# Patient Record
Sex: Male | Born: 1970 | Hispanic: No | Marital: Married | Smoking: Former smoker
Health system: Southern US, Community
[De-identification: ages and names within clinical notes are randomized; demographics above are authoritative.]

## PROBLEM LIST (undated history)

## (undated) DIAGNOSIS — K644 Residual hemorrhoidal skin tags: Secondary | ICD-10-CM

## (undated) DIAGNOSIS — Z9109 Other allergy status, other than to drugs and biological substances: Secondary | ICD-10-CM

## (undated) DIAGNOSIS — K219 Gastro-esophageal reflux disease without esophagitis: Secondary | ICD-10-CM

## (undated) DIAGNOSIS — L309 Dermatitis, unspecified: Secondary | ICD-10-CM

## (undated) DIAGNOSIS — IMO0002 Reserved for concepts with insufficient information to code with codable children: Secondary | ICD-10-CM

## (undated) DIAGNOSIS — M26609 Unspecified temporomandibular joint disorder, unspecified side: Secondary | ICD-10-CM

## (undated) HISTORY — DX: Dermatitis, unspecified: L30.9

## (undated) HISTORY — DX: Unspecified temporomandibular joint disorder, unspecified side: M26.609

## (undated) HISTORY — PX: TONSILLECTOMY: SUR1361

## (undated) HISTORY — DX: Residual hemorrhoidal skin tags: K64.4

## (undated) HISTORY — DX: Gastro-esophageal reflux disease without esophagitis: K21.9

## (undated) HISTORY — DX: Other allergy status, other than to drugs and biological substances: Z91.09

## (undated) HISTORY — DX: Reserved for concepts with insufficient information to code with codable children: IMO0002

---

## 2010-07-04 HISTORY — PX: ANTERIOR CRUCIATE LIGAMENT REPAIR: SHX115

## 2011-08-19 ENCOUNTER — Ambulatory Visit (INDEPENDENT_AMBULATORY_CARE_PROVIDER_SITE_OTHER): Admitting: Family Medicine

## 2011-08-19 ENCOUNTER — Encounter: Payer: Self-pay | Admitting: Family Medicine

## 2011-08-19 VITALS — BP 108/64 | HR 68 | Temp 98.3°F | Ht 71.75 in | Wt 186.0 lb

## 2011-08-19 DIAGNOSIS — Z0000 Encounter for general adult medical examination without abnormal findings: Secondary | ICD-10-CM

## 2011-08-19 LAB — CBC WITH DIFFERENTIAL/PLATELET
Basophils Absolute: 0 10*3/uL (ref 0.0–0.1)
Eosinophils Absolute: 0 10*3/uL (ref 0.0–0.7)
HCT: 45.4 % (ref 39.0–52.0)
Lymphs Abs: 1.6 10*3/uL (ref 0.7–4.0)
MCHC: 33.4 g/dL (ref 30.0–36.0)
Monocytes Absolute: 0.4 10*3/uL (ref 0.1–1.0)
Monocytes Relative: 6.2 % (ref 3.0–12.0)
Platelets: 201 10*3/uL (ref 150.0–400.0)
RDW: 13.4 % (ref 11.5–14.6)

## 2011-08-19 LAB — POCT URINALYSIS DIPSTICK
Bilirubin, UA: NEGATIVE
Glucose, UA: NEGATIVE
Ketones, UA: NEGATIVE
Leukocytes, UA: NEGATIVE
Nitrite, UA: NEGATIVE
Protein, UA: NEGATIVE
Spec Grav, UA: 1.02
Urobilinogen, UA: 0.2
pH, UA: 6.5

## 2011-08-19 LAB — LIPID PANEL
Cholesterol: 197 mg/dL (ref 0–200)
HDL: 60.2 mg/dL
LDL Cholesterol: 123 mg/dL — ABNORMAL HIGH (ref 0–99)
Total CHOL/HDL Ratio: 3
Triglycerides: 70 mg/dL (ref 0.0–149.0)
VLDL: 14 mg/dL (ref 0.0–40.0)

## 2011-08-19 LAB — BASIC METABOLIC PANEL
BUN: 18 mg/dL (ref 6–23)
CO2: 28 mEq/L (ref 19–32)
Chloride: 102 mEq/L (ref 96–112)
Creatinine, Ser: 0.9 mg/dL (ref 0.4–1.5)

## 2011-08-19 LAB — HEPATIC FUNCTION PANEL
ALT: 18 U/L (ref 0–53)
Bilirubin, Direct: 0.1 mg/dL (ref 0.0–0.3)
Total Protein: 7.1 g/dL (ref 6.0–8.3)

## 2011-08-19 LAB — TSH: TSH: 1.49 u[IU]/mL (ref 0.35–5.50)

## 2011-08-19 MED ORDER — HYDROCORTISONE ACETATE 25 MG RE SUPP: 25.0000 mg | Freq: Two times a day (BID) | RECTAL | Status: AC | PRN

## 2011-08-22 ENCOUNTER — Encounter: Payer: Self-pay | Admitting: Family Medicine

## 2011-08-22 NOTE — Progress Notes (Signed)
  Subjective:    Patient ID: Peter Macias, male    DOB: 1970/11/07, 41 y.o.   MRN: [id number]  HPI 41 yr old male to establish and for a cpx. He had been getting his medical care at the [place] but has transferred to [place]. He is a Education officer, community in town. He feels fine in general. He has some intermittent lower back pain that he controls with exercise. He runs now and then, he does core strengthening, and he lifts some weights. He does have some hemorrhoids that flare up from time to time. He has used several rx creams but these do not help the burning and pain he feels just inside the anus.    Review of Systems  Constitutional: Negative.   HENT: Negative.   Eyes: Negative.   Respiratory: Negative.   Cardiovascular: Negative.   Gastrointestinal: Negative.   Genitourinary: Negative.   Musculoskeletal: Negative.   Skin: Negative.   Neurological: Negative.   Hematological: Negative.   Psychiatric/Behavioral: Negative.        Objective:   Physical Exam  Constitutional: He is oriented to person, place, and time. He appears well-developed and well-nourished. No distress.  HENT:  Head: Normocephalic and atraumatic.  Right Ear: External ear normal.  Left Ear: External ear normal.  Nose: Nose normal.  Mouth/Throat: Oropharynx is clear and moist. No oropharyngeal exudate.  Eyes: Conjunctivae and EOM are normal. Pupils are equal, round, and reactive to light. Right eye exhibits no discharge. Left eye exhibits no discharge. No scleral icterus.  Neck: Neck supple. No JVD present. No tracheal deviation present. No thyromegaly present.  Cardiovascular: Normal rate, regular rhythm, normal heart sounds and intact distal pulses.  Exam reveals no gallop and no friction rub.   No murmur heard. Pulmonary/Chest: Effort normal and breath sounds normal. No respiratory distress. He has no wheezes. He has no rales. He exhibits no tenderness.  Abdominal: Soft. Bowel sounds are normal. He exhibits no distension and no  mass. There is no tenderness. There is no rebound and no guarding.  Genitourinary: Rectum normal, prostate normal and penis normal. Guaiac negative stool. No penile tenderness.  Musculoskeletal: Normal range of motion. He exhibits no edema and no tenderness.  Lymphadenopathy:    He has no cervical adenopathy.  Neurological: He is alert and oriented to person, place, and time. He has normal reflexes. No cranial nerve deficit. He exhibits normal muscle tone. Coordination normal.  Skin: Skin is warm and dry. No rash noted. He is not diaphoretic. No erythema. No pallor.  Psychiatric: He has a normal mood and affect. His behavior is normal. Judgment and thought content normal.          Assessment & Plan:  Well exam. Get fasting labs today. Refer to Dermatology. Try Anusol HC suppositories for the hemorrhoids.

## 2011-08-23 ENCOUNTER — Encounter: Payer: Self-pay | Admitting: Family Medicine

## 2011-08-23 ENCOUNTER — Telehealth: Payer: Self-pay | Admitting: Family Medicine

## 2011-08-23 NOTE — Telephone Encounter (Signed)
Put a copy of lab results in mail.

## 2011-08-23 NOTE — Progress Notes (Signed)
Quick Note:  Left voice message and put a copy of results in mail. ______ 

## 2012-09-14 ENCOUNTER — Ambulatory Visit (INDEPENDENT_AMBULATORY_CARE_PROVIDER_SITE_OTHER): Payer: BC Managed Care – PPO | Admitting: Family Medicine

## 2012-09-14 ENCOUNTER — Encounter: Payer: Self-pay | Admitting: Family Medicine

## 2012-09-14 VITALS — BP 103/72 | Ht 71.0 in | Wt 175.0 lb

## 2012-09-14 DIAGNOSIS — M79661 Pain in right lower leg: Secondary | ICD-10-CM

## 2012-09-14 NOTE — Progress Notes (Signed)
  Subjective:    Patient ID: Peter Macias, male    DOB: Jun 11, 1971, 42 y.o.   MRN: [id number]  HPI chief complaint: Right calf pain  Patient comes in today complaining of right calf pain. He initially injured the calf while skiing. While falling forward on his skis with a fully extended knee he felt a pull along the medial aspect of his right calf. He had some discoloration and swelling as well as a palpable abnormality. He had bruising which extended distally to the medial ankle. He utilize an Ace wrap for compression and his symptoms resolved until he reinjured it earlier this week while working out. Specifically, he was doing burpees when he felt an acute return of pain. He has developed some mild swelling and has returned to using his Ace wrap. He denies pain distally at the Achilles tendon. No significant problems with his cath in the past. He has no problems with walking.  Patient's past medical history is positive for lumbar degenerative disc disease. Surgical history is significant for anterior cruciate ligament reconstruction in the left knee in 2012 done by Dr. Richardson Landry He takes no chronic medications No known drug allergies He is a former smoker, drinks alcohol one to 2 times a week, and is self-employed as a Education officer, community    Review of Systems     Objective:   Physical Exam Well-developed, fit appearing 42 year old male. No acute distress.  Right lower leg: Mild tenderness to palpation along the medial gastroc but not markedly. There is no palpable defect. No spasm. Minimal swelling of the lower leg diffusely. No ecchymosis. Patient has mild pain with calf stretching. Achilles is intact with positive plantar flexion with Thompson's testing. Neurovascularly intact distally. Walking without a limp.      Assessment & Plan:  1. Right lower leg pain secondary to calf strain  I did a brief MSK ultrasound of the right lower leg and did not see any obvious abnormalities of either the  gastrocnemius or soleus. It is possible that the patient has a small injury to the plantaris tendon. Nonetheless, our treatment will consist of compression with a body helix sleeve and a 5/16 inch heel lift. He is okay to bike and start with some light weight lifting. He is to avoid anything explosive such as sprinting for the next 6 weeks. He will wear the body helix compression sleeve with activity. He can discontinue the heel lifts once he is pain-free and back to full activity. He will avoid stretching this acutely injured structure. He is reassured that his Achilles tendon is intact. He will followup for ongoing or recalcitrant issues.

## 2013-02-15 ENCOUNTER — Other Ambulatory Visit: Payer: BC Managed Care – PPO

## 2013-02-22 ENCOUNTER — Other Ambulatory Visit (INDEPENDENT_AMBULATORY_CARE_PROVIDER_SITE_OTHER): Payer: No Typology Code available for payment source

## 2013-02-22 DIAGNOSIS — Z0000 Encounter for general adult medical examination without abnormal findings: Secondary | ICD-10-CM

## 2013-02-22 LAB — CBC WITH DIFFERENTIAL/PLATELET
Basophils Absolute: 0 10*3/uL (ref 0.0–0.1)
Basophils Relative: 0.4 % (ref 0.0–3.0)
Eosinophils Relative: 2.1 % (ref 0.0–5.0)
HCT: 44.3 % (ref 39.0–52.0)
Hemoglobin: 15.3 g/dL (ref 13.0–17.0)
Lymphocytes Relative: 36.5 % (ref 12.0–46.0)
Lymphs Abs: 1.7 10*3/uL (ref 0.7–4.0)
Monocytes Relative: 8 % (ref 3.0–12.0)
Neutro Abs: 2.5 10*3/uL (ref 1.4–7.7)
RBC: 5.05 Mil/uL (ref 4.22–5.81)
RDW: 13.8 % (ref 11.5–14.6)

## 2013-02-22 LAB — POCT URINALYSIS DIPSTICK
Blood, UA: NEGATIVE
Glucose, UA: NEGATIVE
Nitrite, UA: NEGATIVE
Protein, UA: NEGATIVE
Spec Grav, UA: 1.02
Urobilinogen, UA: 0.2
pH, UA: 6.5

## 2013-02-22 LAB — BASIC METABOLIC PANEL
Calcium: 9.3 mg/dL (ref 8.4–10.5)
GFR: 96.44 mL/min (ref >60.00)
Glucose, Bld: 99 mg/dL (ref 70–99)
Potassium: 4.3 mEq/L (ref 3.5–5.1)
Sodium: 138 mEq/L (ref 135–145)

## 2013-02-22 LAB — TSH: TSH: 1.7 u[IU]/mL (ref 0.35–5.50)

## 2013-02-22 LAB — HEPATIC FUNCTION PANEL
ALT: 18 U/L (ref 0–53)
AST: 17 U/L (ref 0–37)
Albumin: 4.2 g/dL (ref 3.5–5.2)
Alkaline Phosphatase: 60 U/L (ref 39–117)
Total Protein: 6.9 g/dL (ref 6.0–8.3)

## 2013-02-22 LAB — LIPID PANEL
LDL Cholesterol: 99 mg/dL (ref 0–99)
Total CHOL/HDL Ratio: 3
Triglycerides: 63 mg/dL (ref 0.0–149.0)

## 2013-03-15 ENCOUNTER — Encounter: Payer: BC Managed Care – PPO | Admitting: Family Medicine

## 2013-03-22 ENCOUNTER — Encounter: Payer: Self-pay | Admitting: Family Medicine

## 2013-03-22 ENCOUNTER — Ambulatory Visit (INDEPENDENT_AMBULATORY_CARE_PROVIDER_SITE_OTHER): Payer: No Typology Code available for payment source | Admitting: Family Medicine

## 2013-03-22 VITALS — BP 108/70 | HR 79 | Temp 98.1°F | Ht 71.25 in | Wt 174.0 lb

## 2013-03-22 DIAGNOSIS — Z0000 Encounter for general adult medical examination without abnormal findings: Secondary | ICD-10-CM

## 2013-03-22 NOTE — Progress Notes (Signed)
  Subjective:    Patient ID: Peter Macias, male    DOB: 1971/04/18, 42 y.o.   MRN: [id number]  HPI 42 yr old male for a cpx. He feels well.    Review of Systems  Constitutional: Negative.   HENT: Negative.   Eyes: Negative.   Respiratory: Negative.   Cardiovascular: Negative.   Gastrointestinal: Negative.   Genitourinary: Negative.   Musculoskeletal: Negative.   Skin: Negative.   Neurological: Negative.   Psychiatric/Behavioral: Negative.        Objective:   Physical Exam  Constitutional: He is oriented to person, place, and time. He appears well-developed and well-nourished. No distress.  HENT:  Head: Normocephalic and atraumatic.  Right Ear: External ear normal.  Left Ear: External ear normal.  Nose: Nose normal.  Mouth/Throat: Oropharynx is clear and moist. No oropharyngeal exudate.  Eyes: Conjunctivae and EOM are normal. Pupils are equal, round, and reactive to light. Right eye exhibits no discharge. Left eye exhibits no discharge. No scleral icterus.  Neck: Neck supple. No JVD present. No tracheal deviation present. No thyromegaly present.  Cardiovascular: Normal rate, regular rhythm, normal heart sounds and intact distal pulses.  Exam reveals no gallop and no friction rub.   No murmur heard. Pulmonary/Chest: Effort normal and breath sounds normal. No respiratory distress. He has no wheezes. He has no rales. He exhibits no tenderness.  Abdominal: Soft. Bowel sounds are normal. He exhibits no distension and no mass. There is no tenderness. There is no rebound and no guarding.  Genitourinary: Rectum normal, prostate normal and penis normal. Guaiac negative stool. No penile tenderness.  Musculoskeletal: Normal range of motion. He exhibits no edema and no tenderness.  Lymphadenopathy:    He has no cervical adenopathy.  Neurological: He is alert and oriented to person, place, and time. He has normal reflexes. No cranial nerve deficit. He exhibits normal muscle tone.  Coordination normal.  Skin: Skin is warm and dry. No rash noted. He is not diaphoretic. No erythema. No pallor.  Psychiatric: He has a normal mood and affect. His behavior is normal. Judgment and thought content normal.          Assessment & Plan:  Well exam.

## 2013-03-24 ENCOUNTER — Encounter: Payer: Self-pay | Admitting: Family Medicine

## 2013-03-25 ENCOUNTER — Other Ambulatory Visit: Payer: BC Managed Care – PPO

## 2014-08-12 ENCOUNTER — Telehealth: Payer: Self-pay | Admitting: Family Medicine

## 2014-08-12 NOTE — Telephone Encounter (Signed)
Pt would like to have a cpe on feb 19.  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  Pt only has this day off.  pls advise.

## 2014-08-13 NOTE — Telephone Encounter (Signed)
Please set up physicals for both of them that day

## 2014-08-13 NOTE — Telephone Encounter (Signed)
Lm on vm to cb and sched appts.

## 2014-08-13 NOTE — Telephone Encounter (Signed)
done

## 2014-08-22 ENCOUNTER — Encounter: Payer: Self-pay | Admitting: Family Medicine

## 2014-08-22 ENCOUNTER — Ambulatory Visit (INDEPENDENT_AMBULATORY_CARE_PROVIDER_SITE_OTHER): Payer: No Typology Code available for payment source | Admitting: Family Medicine

## 2014-08-22 VITALS — BP 98/63 | HR 56 | Temp 97.7°F | Ht 71.25 in | Wt 186.0 lb

## 2014-08-22 DIAGNOSIS — Z0000 Encounter for general adult medical examination without abnormal findings: Secondary | ICD-10-CM

## 2014-08-22 DIAGNOSIS — Z23 Encounter for immunization: Secondary | ICD-10-CM

## 2014-08-22 LAB — HEPATIC FUNCTION PANEL
ALK PHOS: 60 U/L (ref 39–117)
ALT: 17 U/L (ref 0–53)
AST: 23 U/L (ref 0–37)
Albumin: 4.7 g/dL (ref 3.5–5.2)
BILIRUBIN DIRECT: 0.3 mg/dL (ref 0.0–0.3)
Total Bilirubin: 2 mg/dL — ABNORMAL HIGH (ref 0.2–1.2)
Total Protein: 7.3 g/dL (ref 6.0–8.3)

## 2014-08-22 LAB — CBC WITH DIFFERENTIAL/PLATELET
BASOS ABS: 0 10*3/uL (ref 0.0–0.1)
BASOS PCT: 0.4 % (ref 0.0–3.0)
EOS PCT: 1.8 % (ref 0.0–5.0)
Eosinophils Absolute: 0.1 10*3/uL (ref 0.0–0.7)
HCT: 43.8 % (ref 39.0–52.0)
Hemoglobin: 15.1 g/dL (ref 13.0–17.0)
LYMPHS ABS: 2 10*3/uL (ref 0.7–4.0)
Lymphocytes Relative: 39 % (ref 12.0–46.0)
MCHC: 34.4 g/dL (ref 30.0–36.0)
MCV: 87.2 fl (ref 78.0–100.0)
MONOS PCT: 7.1 % (ref 3.0–12.0)
Monocytes Absolute: 0.4 10*3/uL (ref 0.1–1.0)
NEUTROS ABS: 2.7 10*3/uL (ref 1.4–7.7)
Neutrophils Relative %: 51.7 % (ref 43.0–77.0)
Platelets: 227 10*3/uL (ref 150.0–400.0)
RBC: 5.03 Mil/uL (ref 4.22–5.81)
RDW: 13.2 % (ref 11.5–15.5)
WBC: 5.1 10*3/uL (ref 4.0–10.5)

## 2014-08-22 LAB — BASIC METABOLIC PANEL
BUN: 17 mg/dL (ref 6–23)
CO2: 30 mEq/L (ref 19–32)
CREATININE: 0.95 mg/dL (ref 0.40–1.50)
Calcium: 9.8 mg/dL (ref 8.4–10.5)
Chloride: 102 mEq/L (ref 96–112)
GFR: 92.25 mL/min (ref >60.00)
Glucose, Bld: 80 mg/dL (ref 70–99)
Potassium: 3.7 mEq/L (ref 3.5–5.1)
SODIUM: 138 meq/L (ref 135–145)

## 2014-08-22 LAB — POCT URINALYSIS DIPSTICK
Bilirubin, UA: NEGATIVE
Blood, UA: NEGATIVE
Glucose, UA: NEGATIVE
KETONES UA: NEGATIVE
Leukocytes, UA: NEGATIVE
Nitrite, UA: NEGATIVE
PH UA: 6
PROTEIN UA: NEGATIVE
SPEC GRAV UA: 1.01
Urobilinogen, UA: 0.2

## 2014-08-22 LAB — LIPID PANEL
CHOL/HDL RATIO: 3
Cholesterol: 192 mg/dL (ref 0–200)
HDL: 62.9 mg/dL (ref >39.00)
LDL Cholesterol: 119 mg/dL — ABNORMAL HIGH (ref 0–99)
NONHDL: 129.1
Triglycerides: 52 mg/dL (ref 0.0–149.0)
VLDL: 10.4 mg/dL (ref 0.0–40.0)

## 2014-08-22 LAB — TSH: TSH: 2.03 u[IU]/mL (ref 0.35–4.50)

## 2014-08-22 NOTE — Progress Notes (Signed)
   Subjective:    Patient ID: Peter Macias, male    DOB: [birth year], 44 y.o.   MRN: [id number]  HPI 44 yr old male for a cpx. He feels well. He exercises regularly.    Review of Systems  Constitutional: Negative.   HENT: Negative.   Eyes: Negative.   Respiratory: Negative.   Cardiovascular: Negative.   Gastrointestinal: Negative.   Genitourinary: Negative.   Musculoskeletal: Negative.   Skin: Negative.   Neurological: Negative.   Psychiatric/Behavioral: Negative.        Objective:   Physical Exam  Constitutional: He is oriented to person, place, and time. He appears well-developed and well-nourished. No distress.  HENT:  Head: Normocephalic and atraumatic.  Right Ear: External ear normal.  Left Ear: External ear normal.  Nose: Nose normal.  Mouth/Throat: Oropharynx is clear and moist. No oropharyngeal exudate.  Eyes: Conjunctivae and EOM are normal. Pupils are equal, round, and reactive to light. Right eye exhibits no discharge. Left eye exhibits no discharge. No scleral icterus.  Neck: Neck supple. No JVD present. No tracheal deviation present. No thyromegaly present.  Cardiovascular: Normal rate, regular rhythm, normal heart sounds and intact distal pulses.  Exam reveals no gallop and no friction rub.   No murmur heard. Pulmonary/Chest: Effort normal and breath sounds normal. No respiratory distress. He has no wheezes. He has no rales. He exhibits no tenderness.  Abdominal: Soft. Bowel sounds are normal. He exhibits no distension and no mass. There is no tenderness. There is no rebound and no guarding.  Genitourinary: Rectum normal, prostate normal and penis normal. Guaiac negative stool. No penile tenderness.  Musculoskeletal: Normal range of motion. He exhibits no edema or tenderness.  Lymphadenopathy:    He has no cervical adenopathy.  Neurological: He is alert and oriented to person, place, and time. He has normal reflexes. No cranial nerve deficit. He exhibits normal  muscle tone. Coordination normal.  Skin: Skin is warm and dry. No rash noted. He is not diaphoretic. No erythema. No pallor.  Psychiatric: He has a normal mood and affect. His behavior is normal. Judgment and thought content normal.          Assessment & Plan:  Well exam. Get fasting labs.

## 2014-08-22 NOTE — Addendum Note (Signed)
Addended by: Aggie Hacker A on: 08/22/2014 11:49 AM   Modules accepted: Orders

## 2014-08-22 NOTE — Progress Notes (Signed)
Pre visit review using our clinic review tool, if applicable. No additional management support is needed unless otherwise documented below in the visit note. 

## 2016-07-05 DIAGNOSIS — Z91018 Allergy to other foods: Secondary | ICD-10-CM | POA: Diagnosis not present

## 2016-07-05 DIAGNOSIS — R197 Diarrhea, unspecified: Secondary | ICD-10-CM | POA: Diagnosis not present

## 2016-10-28 DIAGNOSIS — D225 Melanocytic nevi of trunk: Secondary | ICD-10-CM | POA: Diagnosis not present

## 2016-10-28 DIAGNOSIS — D485 Neoplasm of uncertain behavior of skin: Secondary | ICD-10-CM | POA: Diagnosis not present

## 2016-10-28 DIAGNOSIS — D2262 Melanocytic nevi of left upper limb, including shoulder: Secondary | ICD-10-CM | POA: Diagnosis not present

## 2016-10-28 DIAGNOSIS — D2261 Melanocytic nevi of right upper limb, including shoulder: Secondary | ICD-10-CM | POA: Diagnosis not present

## 2016-10-28 DIAGNOSIS — L814 Other melanin hyperpigmentation: Secondary | ICD-10-CM | POA: Diagnosis not present

## 2017-05-19 ENCOUNTER — Encounter: Payer: No Typology Code available for payment source | Admitting: Family Medicine

## 2017-09-20 DIAGNOSIS — S86899A Other injury of other muscle(s) and tendon(s) at lower leg level, unspecified leg, initial encounter: Secondary | ICD-10-CM | POA: Diagnosis not present

## 2017-09-29 ENCOUNTER — Encounter: Payer: Self-pay | Admitting: Family Medicine

## 2017-09-29 ENCOUNTER — Ambulatory Visit (INDEPENDENT_AMBULATORY_CARE_PROVIDER_SITE_OTHER): Payer: 59 | Admitting: Family Medicine

## 2017-09-29 VITALS — BP 120/62 | HR 70 | Temp 98.5°F | Ht 71.0 in | Wt 202.8 lb

## 2017-09-29 DIAGNOSIS — Z23 Encounter for immunization: Secondary | ICD-10-CM | POA: Diagnosis not present

## 2017-09-29 DIAGNOSIS — Z0000 Encounter for general adult medical examination without abnormal findings: Secondary | ICD-10-CM

## 2017-09-29 LAB — HEPATIC FUNCTION PANEL
ALK PHOS: 63 U/L (ref 39–117)
ALT: 20 U/L (ref 0–53)
AST: 21 U/L (ref 0–37)
Albumin: 4.7 g/dL (ref 3.5–5.2)
BILIRUBIN DIRECT: 0.3 mg/dL (ref 0.0–0.3)
BILIRUBIN TOTAL: 2 mg/dL — AB (ref 0.2–1.2)
TOTAL PROTEIN: 7.2 g/dL (ref 6.0–8.3)

## 2017-09-29 LAB — POC URINALSYSI DIPSTICK (AUTOMATED)
Bilirubin, UA: NEGATIVE
Blood, UA: NEGATIVE
Glucose, UA: NEGATIVE
Leukocytes, UA: NEGATIVE
NITRITE UA: NEGATIVE
PROTEIN UA: NEGATIVE
Spec Grav, UA: >=1.03 — AB (ref 1.010–1.025)
UROBILINOGEN UA: 0.2 U/dL
pH, UA: 5.5 (ref 5.0–8.0)

## 2017-09-29 LAB — BASIC METABOLIC PANEL
BUN: 24 mg/dL — ABNORMAL HIGH (ref 6–23)
CALCIUM: 9.7 mg/dL (ref 8.4–10.5)
CO2: 29 meq/L (ref 19–32)
CREATININE: 1.06 mg/dL (ref 0.40–1.50)
Chloride: 100 mEq/L (ref 96–112)
GFR: 80.14 mL/min (ref >60.00)
Glucose, Bld: 84 mg/dL (ref 70–99)
Potassium: 4.1 mEq/L (ref 3.5–5.1)
SODIUM: 138 meq/L (ref 135–145)

## 2017-09-29 LAB — CBC WITH DIFFERENTIAL/PLATELET
BASOS ABS: 0 10*3/uL (ref 0.0–0.1)
Basophils Relative: 0.1 % (ref 0.0–3.0)
EOS ABS: 0.1 10*3/uL (ref 0.0–0.7)
Eosinophils Relative: 0.7 % (ref 0.0–5.0)
HCT: 44 % (ref 39.0–52.0)
Hemoglobin: 15.2 g/dL (ref 13.0–17.0)
LYMPHS ABS: 1.8 10*3/uL (ref 0.7–4.0)
Lymphocytes Relative: 20.4 % (ref 12.0–46.0)
MCHC: 34.6 g/dL (ref 30.0–36.0)
MCV: 88.7 fl (ref 78.0–100.0)
MONO ABS: 0.5 10*3/uL (ref 0.1–1.0)
Monocytes Relative: 5.3 % (ref 3.0–12.0)
NEUTROS PCT: 73.5 % (ref 43.0–77.0)
Neutro Abs: 6.5 10*3/uL (ref 1.4–7.7)
Platelets: 229 10*3/uL (ref 150.0–400.0)
RBC: 4.96 Mil/uL (ref 4.22–5.81)
RDW: 13.5 % (ref 11.5–15.5)
WBC: 8.8 10*3/uL (ref 4.0–10.5)

## 2017-09-29 LAB — LIPID PANEL
CHOL/HDL RATIO: 3
Cholesterol: 201 mg/dL — ABNORMAL HIGH (ref 0–200)
HDL: 59.8 mg/dL (ref >39.00)
LDL Cholesterol: 118 mg/dL — ABNORMAL HIGH (ref 0–99)
NonHDL: 141.47
Triglycerides: 116 mg/dL (ref 0.0–149.0)
VLDL: 23.2 mg/dL (ref 0.0–40.0)

## 2017-09-29 LAB — TSH: TSH: 1.59 u[IU]/mL (ref 0.35–4.50)

## 2017-09-29 NOTE — Progress Notes (Signed)
   Subjective:    Patient ID: Peter Macias, male    DOB: Feb 25, 1971, 47 y.o.   MRN: [id number]  HPI Here for a well exam. He feels good.    Review of Systems  Constitutional: Negative.   HENT: Negative.   Eyes: Negative.   Respiratory: Negative.   Cardiovascular: Negative.   Gastrointestinal: Negative.   Genitourinary: Negative.   Musculoskeletal: Negative.   Skin: Negative.   Neurological: Negative.   Psychiatric/Behavioral: Negative.        Objective:   Physical Exam  Constitutional: He is oriented to person, place, and time. He appears well-developed and well-nourished. No distress.  HENT:  Head: Normocephalic and atraumatic.  Right Ear: External ear normal.  Left Ear: External ear normal.  Nose: Nose normal.  Mouth/Throat: Oropharynx is clear and moist. No oropharyngeal exudate.  Eyes: Pupils are equal, round, and reactive to light. Conjunctivae and EOM are normal. Right eye exhibits no discharge. Left eye exhibits no discharge. No scleral icterus.  Neck: Neck supple. No JVD present. No tracheal deviation present. No thyromegaly present.  Cardiovascular: Normal rate, regular rhythm, normal heart sounds and intact distal pulses. Exam reveals no gallop and no friction rub.  No murmur heard. Pulmonary/Chest: Effort normal and breath sounds normal. No respiratory distress. He has no wheezes. He has no rales. He exhibits no tenderness.  Abdominal: Soft. Bowel sounds are normal. He exhibits no distension and no mass. There is no tenderness. There is no rebound and no guarding.  Genitourinary: Rectum normal, prostate normal and penis normal. Rectal exam shows guaiac negative stool. No penile tenderness.  Musculoskeletal: Normal range of motion. He exhibits no edema or tenderness.  Lymphadenopathy:    He has no cervical adenopathy.  Neurological: He is alert and oriented to person, place, and time. He has normal reflexes. No cranial nerve deficit. He exhibits normal muscle tone.  Coordination normal.  Skin: Skin is warm and dry. No rash noted. He is not diaphoretic. No erythema. No pallor.  Psychiatric: He has a normal mood and affect. His behavior is normal. Judgment and thought content normal.          Assessment & Plan:  Well exam. We discussed diet and exercise. Get fasting labs.  Alysia Penna, MD

## 2018-05-25 DIAGNOSIS — H04123 Dry eye syndrome of bilateral lacrimal glands: Secondary | ICD-10-CM | POA: Diagnosis not present

## 2018-05-25 DIAGNOSIS — H01022 Squamous blepharitis right lower eyelid: Secondary | ICD-10-CM | POA: Diagnosis not present

## 2018-05-25 DIAGNOSIS — H01021 Squamous blepharitis right upper eyelid: Secondary | ICD-10-CM | POA: Diagnosis not present

## 2018-09-27 ENCOUNTER — Encounter: Payer: Self-pay | Admitting: Family Medicine

## 2018-10-05 ENCOUNTER — Encounter: Payer: 59 | Admitting: Family Medicine

## 2018-11-12 ENCOUNTER — Telehealth: Payer: Self-pay | Admitting: Family Medicine

## 2018-11-12 NOTE — Telephone Encounter (Signed)
He needs a letter from me to allow him to wear an N95 face mask at work. This letter was written.

## 2019-01-11 ENCOUNTER — Other Ambulatory Visit: Payer: Self-pay

## 2019-01-11 ENCOUNTER — Encounter: Payer: Self-pay | Admitting: Family Medicine

## 2019-01-11 ENCOUNTER — Ambulatory Visit (INDEPENDENT_AMBULATORY_CARE_PROVIDER_SITE_OTHER): Payer: No Typology Code available for payment source | Admitting: Family Medicine

## 2019-01-11 VITALS — BP 108/80 | HR 61 | Temp 97.7°F | Ht 72.0 in | Wt 210.2 lb

## 2019-01-11 DIAGNOSIS — Z0000 Encounter for general adult medical examination without abnormal findings: Secondary | ICD-10-CM

## 2019-01-11 LAB — LIPID PANEL
Cholesterol: 241 mg/dL — ABNORMAL HIGH (ref 0–200)
HDL: 54 mg/dL (ref >39.00)
LDL Cholesterol: 163 mg/dL — ABNORMAL HIGH (ref 0–99)
NonHDL: 187.02
Total CHOL/HDL Ratio: 4
Triglycerides: 122 mg/dL (ref 0.0–149.0)
VLDL: 24.4 mg/dL (ref 0.0–40.0)

## 2019-01-11 LAB — BASIC METABOLIC PANEL
BUN: 16 mg/dL (ref 6–23)
CO2: 30 mEq/L (ref 19–32)
Calcium: 10 mg/dL (ref 8.4–10.5)
Chloride: 102 mEq/L (ref 96–112)
Creatinine, Ser: 1.04 mg/dL (ref 0.40–1.50)
GFR: 76.64 mL/min (ref >60.00)
Glucose, Bld: 92 mg/dL (ref 70–99)
Potassium: 5.1 mEq/L (ref 3.5–5.1)
Sodium: 139 mEq/L (ref 135–145)

## 2019-01-11 LAB — HEPATIC FUNCTION PANEL
ALT: 26 U/L (ref 0–53)
AST: 18 U/L (ref 0–37)
Albumin: 5.1 g/dL (ref 3.5–5.2)
Alkaline Phosphatase: 62 U/L (ref 39–117)
Bilirubin, Direct: 0.2 mg/dL (ref 0.0–0.3)
Total Bilirubin: 1.7 mg/dL — ABNORMAL HIGH (ref 0.2–1.2)
Total Protein: 7.4 g/dL (ref 6.0–8.3)

## 2019-01-11 LAB — CBC WITH DIFFERENTIAL/PLATELET
Basophils Absolute: 0 10*3/uL (ref 0.0–0.1)
Basophils Relative: 0.3 % (ref 0.0–3.0)
Eosinophils Absolute: 0.1 10*3/uL (ref 0.0–0.7)
Eosinophils Relative: 1.7 % (ref 0.0–5.0)
HCT: 47.3 % (ref 39.0–52.0)
Hemoglobin: 16 g/dL (ref 13.0–17.0)
Lymphocytes Relative: 33.4 % (ref 12.0–46.0)
Lymphs Abs: 1.9 10*3/uL (ref 0.7–4.0)
MCHC: 33.8 g/dL (ref 30.0–36.0)
MCV: 89.4 fl (ref 78.0–100.0)
Monocytes Absolute: 0.5 10*3/uL (ref 0.1–1.0)
Monocytes Relative: 8.9 % (ref 3.0–12.0)
Neutro Abs: 3.2 10*3/uL (ref 1.4–7.7)
Neutrophils Relative %: 55.7 % (ref 43.0–77.0)
Platelets: 241 10*3/uL (ref 150.0–400.0)
RBC: 5.29 Mil/uL (ref 4.22–5.81)
RDW: 13.4 % (ref 11.5–15.5)
WBC: 5.7 10*3/uL (ref 4.0–10.5)

## 2019-01-11 LAB — POC URINALSYSI DIPSTICK (AUTOMATED)
Bilirubin, UA: NEGATIVE
Blood, UA: NEGATIVE
Glucose, UA: NEGATIVE
Ketones, UA: NEGATIVE
Leukocytes, UA: NEGATIVE
Nitrite, UA: NEGATIVE
Protein, UA: NEGATIVE
Spec Grav, UA: 1.01 (ref 1.010–1.025)
Urobilinogen, UA: 0.2 E.U./dL
pH, UA: 6.5 (ref 5.0–8.0)

## 2019-01-11 LAB — TSH: TSH: 2.71 u[IU]/mL (ref 0.35–4.50)

## 2019-01-11 NOTE — Progress Notes (Signed)
   Subjective:    Patient ID: Peter Macias, male    DOB: 1971-03-20, 48 y.o.   MRN: [id number]  HPI Here for a well exam. He feels great. He takes OTC Pepcid for occasional GERD.    Review of Systems  Constitutional: Negative.   HENT: Negative.   Eyes: Negative.   Respiratory: Negative.   Cardiovascular: Negative.   Gastrointestinal: Negative.   Genitourinary: Negative.   Musculoskeletal: Negative.   Skin: Negative.   Neurological: Negative.   Psychiatric/Behavioral: Negative.        Objective:   Physical Exam Constitutional:      General: He is not in acute distress.    Appearance: He is well-developed. He is not diaphoretic.  HENT:     Head: Normocephalic and atraumatic.     Right Ear: External ear normal.     Left Ear: External ear normal.     Nose: Nose normal.     Mouth/Throat:     Pharynx: No oropharyngeal exudate.  Eyes:     General: No scleral icterus.       Right eye: No discharge.        Left eye: No discharge.     Conjunctiva/sclera: Conjunctivae normal.     Pupils: Pupils are equal, round, and reactive to light.  Neck:     Musculoskeletal: Neck supple.     Thyroid: No thyromegaly.     Vascular: No JVD.     Trachea: No tracheal deviation.  Cardiovascular:     Rate and Rhythm: Normal rate and regular rhythm.     Heart sounds: Normal heart sounds. No murmur. No friction rub. No gallop.   Pulmonary:     Effort: Pulmonary effort is normal. No respiratory distress.     Breath sounds: Normal breath sounds. No wheezing or rales.  Chest:     Chest wall: No tenderness.  Abdominal:     General: Bowel sounds are normal. There is no distension.     Palpations: Abdomen is soft. There is no mass.     Tenderness: There is no abdominal tenderness. There is no guarding or rebound.  Genitourinary:    Penis: Normal. No tenderness.      Scrotum/Testes: Normal.  Musculoskeletal: Normal range of motion.        General: No tenderness.  Lymphadenopathy:     Cervical:  No cervical adenopathy.  Skin:    General: Skin is warm and dry.     Coloration: Skin is not pale.     Findings: No erythema or rash.  Neurological:     Mental Status: He is alert and oriented to person, place, and time.     Cranial Nerves: No cranial nerve deficit.     Motor: No abnormal muscle tone.     Coordination: Coordination normal.     Deep Tendon Reflexes: Reflexes are normal and symmetric. Reflexes normal.  Psychiatric:        Behavior: Behavior normal.        Thought Content: Thought content normal.        Judgment: Judgment normal.           Assessment & Plan:  Well exam. We discussed diet and exercise. Get fasting labs. Alysia Penna, MD

## 2019-01-18 ENCOUNTER — Encounter: Payer: Self-pay | Admitting: Family Medicine

## 2019-01-21 ENCOUNTER — Encounter: Payer: Self-pay | Admitting: Family Medicine

## 2019-02-21 ENCOUNTER — Other Ambulatory Visit: Payer: Self-pay

## 2019-02-21 DIAGNOSIS — Z20822 Contact with and (suspected) exposure to covid-19: Secondary | ICD-10-CM

## 2019-02-22 LAB — NOVEL CORONAVIRUS, NAA: SARS-CoV-2, NAA: NOT DETECTED

## 2019-02-25 ENCOUNTER — Other Ambulatory Visit: Payer: Self-pay

## 2019-02-25 DIAGNOSIS — Z20822 Contact with and (suspected) exposure to covid-19: Secondary | ICD-10-CM

## 2019-02-26 LAB — NOVEL CORONAVIRUS, NAA: SARS-CoV-2, NAA: NOT DETECTED

## 2019-03-13 ENCOUNTER — Other Ambulatory Visit: Payer: Self-pay | Admitting: Family Medicine

## 2019-03-13 ENCOUNTER — Telehealth: Payer: No Typology Code available for payment source | Admitting: Family Medicine

## 2019-03-13 ENCOUNTER — Telehealth: Payer: Self-pay | Admitting: Family Medicine

## 2019-03-13 DIAGNOSIS — Z209 Contact with and (suspected) exposure to unspecified communicable disease: Secondary | ICD-10-CM

## 2019-03-13 NOTE — Telephone Encounter (Signed)
Done

## 2019-03-13 NOTE — Progress Notes (Signed)
He is interested in checking for antibodies to the Covid 19 virus. His wife is recovering from a test positive Covid infection, and Peter Macias never had symptoms. He tested negative twice for the virus, but both of them want to check for antibodies. We will arrange this

## 2019-03-15 ENCOUNTER — Other Ambulatory Visit: Payer: No Typology Code available for payment source

## 2019-03-15 ENCOUNTER — Other Ambulatory Visit: Payer: Self-pay

## 2019-03-15 DIAGNOSIS — Z209 Contact with and (suspected) exposure to unspecified communicable disease: Secondary | ICD-10-CM

## 2019-03-15 LAB — SARS-COV-2 IGG: SARS-COV-2 IgG: 0.27

## 2019-04-15 ENCOUNTER — Encounter: Payer: Self-pay | Admitting: Family Medicine

## 2019-04-15 ENCOUNTER — Other Ambulatory Visit: Payer: Self-pay

## 2019-04-15 ENCOUNTER — Ambulatory Visit (INDEPENDENT_AMBULATORY_CARE_PROVIDER_SITE_OTHER): Payer: No Typology Code available for payment source | Admitting: Family Medicine

## 2019-04-15 VITALS — BP 106/70 | Ht 72.0 in | Wt 205.0 lb

## 2019-04-15 DIAGNOSIS — M25572 Pain in left ankle and joints of left foot: Secondary | ICD-10-CM

## 2019-04-15 NOTE — Patient Instructions (Signed)
Your exam and ultrasound are reassuring. You have underlying ankle instability with sprain of the capsule but no evidence of a tendon tear, new ligament tear, or chip fracture. Ice the area for 15 minutes at a time, 3-4 times a day Aleve 2 tabs twice a day with food OR ibuprofen 3 tabs three times a day with food for pain and inflammation as needed. Elevate above the level of your heart when possible if swollen. Consider laceup ankle brace when exercising until pain resolves. I would recommend doing theraband strengthening and balance exercises daily. Consider physical therapy for strengthening and balance exercises. Follow up with me or Dr. Micheline Chapman to talk about your neck further and have this fully examined.

## 2019-04-15 NOTE — Progress Notes (Signed)
PCP: Laurey Morale, MD  Subjective:   HPI: Patient is a 48 y.o. male here for left ankle injury.  Patient reports last night he accidentally inverted his left ankle. He has prior history of instability of this ankle and several sprains from time playing sports. Denies swelling, bruising but has pain lateral ankle going into dorsal proximal left foot. Pain is more dull and a soreness, minimal currently. He wanted to make sure he wasn't injuring something further. Has been working out including running 6 miles on Saturday. Has been elevating but not taking any medications for this. No skin changes.  Past Medical History:  Diagnosis Date  . Degenerative disk disease    in C1-2, also in L3-4 and L4-5   . Eczema   . Environmental allergies   . GERD (gastroesophageal reflux disease)   . Hemorrhoids, external   . TMJ (temporomandibular joint syndrome)     Current Outpatient Medications on File Prior to Visit  Medication Sig Dispense Refill  . famotidine (PEPCID) 20 MG tablet     . ibuprofen (ADVIL,MOTRIN) 200 MG tablet Take 200 mg by mouth as needed.     No current facility-administered medications on file prior to visit.     Past Surgical History:  Procedure Laterality Date  . ANTERIOR CRUCIATE LIGAMENT REPAIR  2012   per Dr. Amada Jupiter, used allograft   . TONSILLECTOMY      No Known Allergies  Social History   Socioeconomic History  . Marital status: Married    Spouse name: Not on file  . Number of children: Not on file  . Years of education: Not on file  . Highest education level: Not on file  Occupational History  . Not on file  Social Needs  . Financial resource strain: Not on file  . Food insecurity    Worry: Not on file    Inability: Not on file  . Transportation needs    Medical: Not on file    Non-medical: Not on file  Tobacco Use  . Smoking status: Former Research scientist (life sciences)  . Smokeless tobacco: Never Used  Substance and Sexual Activity  . Alcohol use: Yes    Alcohol/week: 0.0 standard drinks    Comment: occ  . Drug use: No  . Sexual activity: Not on file  Lifestyle  . Physical activity    Days per week: Not on file    Minutes per session: Not on file  . Stress: Not on file  Relationships  . Social Herbalist on phone: Not on file    Gets together: Not on file    Attends religious service: Not on file    Active member of club or organization: Not on file    Attends meetings of clubs or organizations: Not on file    Relationship status: Not on file  . Intimate partner violence    Fear of current or ex partner: Not on file    Emotionally abused: Not on file    Physically abused: Not on file    Forced sexual activity: Not on file  Other Topics Concern  . Not on file  Social History Narrative  . Not on file    Family History  Problem Relation Age of Onset  . Arthritis Mother   . Depression Mother   . Melanoma Father     BP 106/70   Ht 6' (1.829 m)   Wt 205 lb (93 kg)   BMI 27.80 kg/m  Review of Systems: See HPI above.     Objective:  Physical Exam:  Gen: NAD, comfortable in exam room  Left ankle: No gross deformity, swelling, ecchymoses. FROM with 5/5 strength but mild pain lateral foot/ankle with full IR. TTP minimally over ATFL/sinus tarsi. 1+ ant drawer and talar tilt.   Negative syndesmotic compression. Thompsons test negative. NV intact distally.  Right ankle: No gross deformity, swelling, ecchymoses FROM TTP Tract ant drawer, negative talar tilt.   Negative syndesmotic compression. Thompsons test negative. NV intact distally.  MSK u/s left ankle: No ankle effusion noted.  ATFL with some fibers visualized intact though laxity on dynamic views.  No talar dome abnormalities.  Peroneal tendons thickened but without tear or tenosynovitis.  Assessment & Plan:  1. Left ankle pain - consistent with instability, likely sprain of capsule but without effusion, chip of talar dome or lateral  malleolus, or new acute ATFL sprain.  Icing, aleve or ibuprofen if needed.  Discussed home exercises.  Consider ASO, physical therapy. F/u prn for this issue.  He reported also having some pain in his neck - he's going to make an appointment to follow up with [place] about this.

## 2019-04-16 ENCOUNTER — Other Ambulatory Visit: Payer: Self-pay

## 2019-04-16 DIAGNOSIS — Z20822 Contact with and (suspected) exposure to covid-19: Secondary | ICD-10-CM

## 2019-04-17 LAB — NOVEL CORONAVIRUS, NAA: SARS-CoV-2, NAA: NOT DETECTED

## 2019-04-26 ENCOUNTER — Encounter: Payer: Self-pay | Admitting: Family Medicine

## 2019-04-26 ENCOUNTER — Ambulatory Visit (INDEPENDENT_AMBULATORY_CARE_PROVIDER_SITE_OTHER): Payer: No Typology Code available for payment source | Admitting: Family Medicine

## 2019-04-26 ENCOUNTER — Other Ambulatory Visit: Payer: Self-pay

## 2019-04-26 VITALS — BP 102/64 | Ht 71.0 in | Wt 205.0 lb

## 2019-04-26 DIAGNOSIS — M542 Cervicalgia: Secondary | ICD-10-CM | POA: Diagnosis not present

## 2019-04-26 NOTE — Patient Instructions (Addendum)
I have put in for your X rays I will send you a note IF there is anything worrisome or unexpected  I will call you  Shoulder press, cross  pulls and upright rows are the additions to your exercise regimen  Shoulder press: Goal is hi rep med weight so 15-18 per set,3-5 sets at a workout 3-5 times a week: GOOD FORM is necessary with neck position neutral  Deep tissue muscle massage or active release Continue other exercises including supermans etc  Deep tissue massage: I use [facility] on [address] [phone] Peter Macias

## 2019-04-27 DIAGNOSIS — M542 Cervicalgia: Secondary | ICD-10-CM | POA: Insufficient documentation

## 2019-04-27 NOTE — Progress Notes (Signed)
  Peter Macias - 48 y.o. male MRN [id number]  Date of birth: 05-09-1971    SUBJECTIVE:      Chief Complaint:/ HPI:   Neck pain worsening over last several weeks. 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 In past, he worked out at gym and it got better. Di\ue to COVID, his gym time was cut for months. He has had some chiropractic that also helped. Pain is in area of occiput, worse with progression of day. No true headache. Also has some tightness in trapezius area.      ROS:     No visual changes, no extremity weakness, no numbness or tingling in his upper extremities.  Denies fever.  PERTINENT  PMH / PSH FH / / SH:  Past Medical, Surgical, Social, and Family History Reviewed & Updated in the EMR.  Pertinent findings include:  No neck surgeries No personal hx Dm  OBJECTIVE: BP 102/64   Ht 5\' 11"  (1.803 m)   Wt 205 lb (93 kg)   BMI 28.59 kg/m   Physical Exam:  Vital signs are reviewed. GENERAL well-developed male no acute distress neck: Full range of motion in flexion extension.  Negative Spurling's.  Tender to palpation at the occiput bilaterally left greater than right.  Lateral rotation then side bending motion normal and painless. EXTREMITY: Intact strength C4-5 and 6 upper extremity bilaterally NEURO: DTRs forearm, elbow bilaterally 2+ symmetrical.  Ms. BACK: Scapula movement is symmetrical with no winging.  Shoulder posture is stooped.  ASSESSMENT & PLAN:  See problem based charting & AVS for pt instructions. 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  There are no red flags for myelopathy, herniated disc or other unusual pathology.  We will start with conservative treatment.  We discussed at length.  He would like to get plain x-rays and I will send him a note about that.  I recommended continue chiropractic, add deep tissue massage, additional  exercises to be added to his regular regimen.  Please see AVS.  Follow-up 3 to 4 weeks if not improving.  Discussed red flags.  Follow-up if new or worsening symptoms.

## 2019-04-27 NOTE — Assessment & Plan Note (Signed)
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  There are no red flags for myelopathy, herniated disc or other unusual pathology.  We will start with conservative treatment.  We discussed at length.  He would like to get plain x-rays and I will send him a note about that.  I recommended continue chiropractic, add deep tissue massage, additional exercises to be added to his regular regimen.  Please see AVS.  Follow-up 3 to 4 weeks if not improving.  Discussed red flags.  Follow-up if new or worsening symptoms.

## 2019-05-06 ENCOUNTER — Ambulatory Visit
Admission: RE | Admit: 2019-05-06 | Discharge: 2019-05-06 | Disposition: A | Discharge status: Home / Self Care | Payer: No Typology Code available for payment source | Source: Ambulatory Visit | Attending: Family Medicine | Admitting: Family Medicine

## 2019-05-06 DIAGNOSIS — M542 Cervicalgia: Secondary | ICD-10-CM

## 2019-05-10 NOTE — Telephone Encounter (Signed)
They show some mild disc disease but nothing dramatic. Follow up as needed

## 2019-05-14 NOTE — Telephone Encounter (Signed)
I reviewed his Xray and it only shows some mild degenerative discs at 2 levels. There is no procedure to fix this, but I agree with Dr. Loyal Buba that options like chiropractic sessions or PT are the best to try now

## 2019-05-14 NOTE — Telephone Encounter (Signed)
Please advise 

## 2019-05-24 ENCOUNTER — Encounter: Payer: Self-pay | Admitting: Family Medicine

## 2019-06-18 ENCOUNTER — Encounter: Payer: Self-pay | Admitting: Family Medicine

## 2019-07-19 ENCOUNTER — Encounter: Payer: Self-pay | Admitting: Family Medicine

## 2019-07-19 ENCOUNTER — Ambulatory Visit (INDEPENDENT_AMBULATORY_CARE_PROVIDER_SITE_OTHER): Payer: No Typology Code available for payment source | Admitting: Family Medicine

## 2019-07-19 VITALS — BP 110/62 | HR 84 | Temp 97.6°F | Wt 209.8 lb

## 2019-07-19 DIAGNOSIS — J069 Acute upper respiratory infection, unspecified: Secondary | ICD-10-CM

## 2019-07-19 MED ORDER — FLOVENT HFA 110 MCG/ACT IN AERO: 2.0000 | INHALATION_SPRAY | Freq: Two times a day (BID) | RESPIRATORY_TRACT | 0 refills | Status: DC

## 2019-07-19 NOTE — Progress Notes (Signed)
   Subjective:    Patient ID: Peter Macias, male    DOB: 1970/10/23, 49 y.o.   MRN: [id number]  HPI Here for a cough that started 3 weeks ago. At first he had a ST and a lot of PND, but these subsided and it moved to his chest. No fever or headache. No chest pain or SOB. The cough is dry. No body aches or NVD. He is using Delsym and running a humidifier in his bedroom. One of his dental partners gave him a Medrol dose pack, and this has helped quite a bit.     Review of Systems  Constitutional: Negative.   HENT: Negative.   Eyes: Negative.   Respiratory: Positive for cough. Negative for chest tightness, shortness of breath and wheezing.   Cardiovascular: Negative.        Objective:   Physical Exam Constitutional:      Appearance: Normal appearance.  HENT:     Right Ear: Tympanic membrane, ear canal and external ear normal.     Left Ear: Tympanic membrane, ear canal and external ear normal.     Nose: Nose normal.     Mouth/Throat:     Pharynx: Oropharynx is clear.  Eyes:     Conjunctiva/sclera: Conjunctivae normal.  Cardiovascular:     Pulses: Normal pulses.     Heart sounds: Normal heart sounds.  Pulmonary:     Effort: Pulmonary effort is normal. No respiratory distress.     Breath sounds: Normal breath sounds. No stridor. No wheezing, rhonchi or rales.  Lymphadenopathy:     Cervical: No cervical adenopathy.  Neurological:     Mental Status: He is alert.           Assessment & Plan:  Viral bronchitis. Try Flovent 2 puffs BID. This should resolve in the next week or two. Recheck prn.  Alysia Penna, MD

## 2019-07-26 ENCOUNTER — Ambulatory Visit: Payer: No Typology Code available for payment source | Admitting: Family Medicine

## 2019-08-11 ENCOUNTER — Other Ambulatory Visit: Payer: Self-pay | Admitting: Family Medicine

## 2019-08-12 NOTE — Telephone Encounter (Signed)
Please advise. Should the patient continue this medication? 

## 2019-08-13 ENCOUNTER — Encounter: Payer: Self-pay | Admitting: Family Medicine

## 2019-08-15 NOTE — Telephone Encounter (Signed)
Noted  

## 2019-11-08 ENCOUNTER — Other Ambulatory Visit: Payer: Self-pay

## 2019-11-08 ENCOUNTER — Encounter: Payer: Self-pay | Admitting: Sports Medicine

## 2019-11-08 ENCOUNTER — Ambulatory Visit (INDEPENDENT_AMBULATORY_CARE_PROVIDER_SITE_OTHER): Payer: No Typology Code available for payment source | Admitting: Sports Medicine

## 2019-11-08 VITALS — BP 120/84 | Ht 71.0 in | Wt 200.0 lb

## 2019-11-08 DIAGNOSIS — S83512A Sprain of anterior cruciate ligament of left knee, initial encounter: Secondary | ICD-10-CM | POA: Diagnosis not present

## 2019-11-08 NOTE — Progress Notes (Addendum)
   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  About 8-9 years ago, he had his left ACL reconstructed after a skiing accident, and since this new accident he has noticed some increased instability.  He feels the knee is swelling at times and making it difficult to walk.  He denies any numbness or tingling into his toes.     ROS:     See HPI.   PERTINENT  PMH / PSH / FH / SH:  Past Medical, Surgical, Social, and Family History Reviewed & Updated in the EMR. Pertinent findings include:  Left ACL reconstruction about 8 years ago, bilateral Osgood-Schlatter's disease, degenerative disc disease of the lumbar spine   OBJECTIVE:  BP 120/84   Ht 5\' 11"  (1.803 m)   Wt 200 lb (90.7 kg)   BMI 27.89 kg/m  Physical Exam:  Vital signs are reviewed.   GEN: Alert and oriented, NAD Pulm: Breathing unlabored PSY: normal mood, congruent affect  MSK: Left knee: Normal to inspection with no erythema or effusion or obvious bony abnormalities. Palpation with no warmth, patellar tenderness, or condyle tenderness. ROM full in flexion, but with pain  He has full extension and lower leg rotation. Increased laxity and Lachman's testing compared to contralateral knee Ligaments with solid consistent endpoints including PCL, LCL, MCL. Positive Mcmurray's and Thessaly tests. Painful patellar compression. Patellar glide without crepitus. Patellar and quadriceps tendons unremarkable. Hamstring and quadriceps strength is normal.  Neurovascularly intact.   ASSESSMENT & PLAN:   1. Left knee pain  Given the history and  mechanism of injury in the setting of increased instability, we will move forward with an MRI of the left knee to evaluate for possible ACL and/or meniscal injury.  He has a brace at home that he does not feel like he needs at this time.  He is not having any pain so declined any medication.  Recommended he focus on quad strengthening exercises.  He will call us back once he determines where he will get the MRI and I will plan to call him with the results.   Lanier Clam, DO, ATC Sports Medicine Fellow  I was the preceptor for this visit and available for immediate consultation Shellia Cleverly, DO

## 2019-11-11 NOTE — Addendum Note (Signed)
Addended by: Jolinda Croak E on: 11/11/2019 01:35 PM   Modules accepted: Orders

## 2019-12-07 ENCOUNTER — Ambulatory Visit
Admission: RE | Admit: 2019-12-07 | Discharge: 2019-12-07 | Disposition: A | Discharge status: Home / Self Care | Payer: No Typology Code available for payment source | Source: Ambulatory Visit | Attending: Sports Medicine | Admitting: Sports Medicine

## 2019-12-07 DIAGNOSIS — S83512A Sprain of anterior cruciate ligament of left knee, initial encounter: Secondary | ICD-10-CM

## 2019-12-16 ENCOUNTER — Other Ambulatory Visit: Payer: Self-pay

## 2019-12-16 ENCOUNTER — Ambulatory Visit (INDEPENDENT_AMBULATORY_CARE_PROVIDER_SITE_OTHER): Payer: No Typology Code available for payment source | Admitting: Sports Medicine

## 2019-12-16 VITALS — BP 110/76 | Ht 71.0 in | Wt 205.0 lb

## 2019-12-16 DIAGNOSIS — M25562 Pain in left knee: Secondary | ICD-10-CM | POA: Diagnosis not present

## 2019-12-16 MED ORDER — METHYLPREDNISOLONE ACETATE 80 MG/ML IJ SUSP
80.0000 mg | Freq: Once | INTRAMUSCULAR | Status: AC
  Administered 2019-12-16: 80 mg via INTRAMUSCULAR

## 2019-12-16 NOTE — Progress Notes (Signed)
Patient ID: Peter Macias, male   DOB: 12/02/1970, 49 y.o.   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  His ACL is degenerated and indistinct but does not appear to be ruptured.  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  His knee pain is improving especially with the use of compression sleeves with exercise.  He denies any feelings of instability.  I recommended conservative treatment in a watchful waiting approach.  He is also complaining of some left-sided low back pain with occasional left leg radiculopathy.  Pain began after playing golf with his son.  It is starting to ease up but is still bothering him.  He has had chronic intermittent low back pain for years.  I discussed trying an 80 mg IM Depo-Medrol injection today and he agrees.  If symptoms persist, consider updated imaging of the lumbar spine.  Follow-up as needed

## 2019-12-16 NOTE — Addendum Note (Signed)
Addended by: Cyd Silence on: 12/16/2019 10:07 AM   Modules accepted: Orders

## 2019-12-24 ENCOUNTER — Encounter: Payer: Self-pay | Admitting: Family Medicine

## 2019-12-25 ENCOUNTER — Ambulatory Visit: Payer: No Typology Code available for payment source | Attending: Internal Medicine

## 2019-12-25 DIAGNOSIS — Z20822 Contact with and (suspected) exposure to covid-19: Secondary | ICD-10-CM

## 2019-12-26 LAB — SARS-COV-2, NAA 2 DAY TAT

## 2019-12-26 LAB — NOVEL CORONAVIRUS, NAA: SARS-CoV-2, NAA: NOT DETECTED

## 2020-12-16 ENCOUNTER — Ambulatory Visit: Payer: BC Managed Care – PPO | Admitting: Family Medicine

## 2021-01-11 ENCOUNTER — Encounter: Payer: Self-pay | Admitting: Family Medicine

## 2021-01-11 NOTE — Telephone Encounter (Signed)
Noted  

## 2021-01-14 ENCOUNTER — Other Ambulatory Visit: Payer: Self-pay

## 2021-01-15 ENCOUNTER — Ambulatory Visit (INDEPENDENT_AMBULATORY_CARE_PROVIDER_SITE_OTHER): Payer: BC Managed Care – PPO | Admitting: Family Medicine

## 2021-01-15 ENCOUNTER — Encounter: Payer: Self-pay | Admitting: Family Medicine

## 2021-01-15 VITALS — BP 110/80 | HR 61 | Temp 98.3°F | Ht 72.0 in | Wt 204.2 lb

## 2021-01-15 DIAGNOSIS — Z1211 Encounter for screening for malignant neoplasm of colon: Secondary | ICD-10-CM

## 2021-01-15 DIAGNOSIS — Z0000 Encounter for general adult medical examination without abnormal findings: Secondary | ICD-10-CM | POA: Diagnosis not present

## 2021-01-15 LAB — CBC WITH DIFFERENTIAL/PLATELET
Basophils Absolute: 0 10*3/uL (ref 0.0–0.1)
Basophils Relative: 0.3 % (ref 0.0–3.0)
Eosinophils Absolute: 0.1 10*3/uL (ref 0.0–0.7)
Eosinophils Relative: 1.6 % (ref 0.0–5.0)
HCT: 42 % (ref 39.0–52.0)
Hemoglobin: 14.4 g/dL (ref 13.0–17.0)
Lymphocytes Relative: 17.6 % (ref 12.0–46.0)
Lymphs Abs: 1.3 10*3/uL (ref 0.7–4.0)
MCHC: 34.3 g/dL (ref 30.0–36.0)
MCV: 87.2 fl (ref 78.0–100.0)
Monocytes Absolute: 0.7 10*3/uL (ref 0.1–1.0)
Monocytes Relative: 9.7 % (ref 3.0–12.0)
Neutro Abs: 5.1 10*3/uL (ref 1.4–7.7)
Neutrophils Relative %: 70.8 % (ref 43.0–77.0)
Platelets: 197 10*3/uL (ref 150.0–400.0)
RBC: 4.82 Mil/uL (ref 4.22–5.81)
RDW: 13.7 % (ref 11.5–15.5)
WBC: 7.2 10*3/uL (ref 4.0–10.5)

## 2021-01-15 LAB — T4, FREE: Free T4: 0.83 ng/dL (ref 0.60–1.60)

## 2021-01-15 LAB — TSH: TSH: 1.65 u[IU]/mL (ref 0.35–5.50)

## 2021-01-15 LAB — T3, FREE: T3, Free: 3.9 pg/mL (ref 2.3–4.2)

## 2021-01-15 MED ORDER — AZITHROMYCIN 250 MG PO TABS: ORAL_TABLET | ORAL | 0 refills | Status: DC

## 2021-01-15 NOTE — Progress Notes (Signed)
Subjective:    Patient ID: Peter Macias, male    DOB: 02-08-1971, 50 y.o.   MRN: [id number]  HPI Here for a well exam. He has been doing well until 4 days ago when he developed left facial pain, PND, and a dry cough. No fever or ST. He says his hemorrhoids flare up at times, and he uses Sitz baths and Preparation H for this. He had labs done recently during a life insurance exam, and these were unremarkable.    Review of Systems  Constitutional: Negative.   HENT:  Positive for sinus pain.   Eyes: Negative.   Respiratory:  Positive for cough.   Cardiovascular: Negative.   Gastrointestinal: Negative.   Genitourinary: Negative.   Musculoskeletal: Negative.   Skin: Negative.   Neurological: Negative.   Psychiatric/Behavioral: Negative.        Objective:   Physical Exam Constitutional:      General: He is not in acute distress.    Appearance: Normal appearance. He is well-developed. He is not diaphoretic.  HENT:     Head: Normocephalic and atraumatic.     Right Ear: External ear normal.     Left Ear: External ear normal.     Nose: Nose normal.     Mouth/Throat:     Pharynx: No oropharyngeal exudate.  Eyes:     General: No scleral icterus.       Right eye: No discharge.        Left eye: No discharge.     Conjunctiva/sclera: Conjunctivae normal.     Pupils: Pupils are equal, round, and reactive to light.  Neck:     Thyroid: No thyromegaly.     Vascular: No JVD.     Trachea: No tracheal deviation.  Cardiovascular:     Rate and Rhythm: Normal rate and regular rhythm.     Heart sounds: Normal heart sounds. No murmur heard.   No friction rub. No gallop.  Pulmonary:     Effort: Pulmonary effort is normal. No respiratory distress.     Breath sounds: Normal breath sounds. No wheezing or rales.  Chest:     Chest wall: No tenderness.  Abdominal:     General: Bowel sounds are normal. There is no distension.     Palpations: Abdomen is soft. There is no mass.     Tenderness:  There is no abdominal tenderness. There is no guarding or rebound.  Genitourinary:    Penis: Normal. No tenderness.      Testes: Normal.     Rectum: Normal.  Musculoskeletal:        General: No tenderness. Normal range of motion.     Cervical back: Neck supple.  Lymphadenopathy:     Cervical: No cervical adenopathy.  Skin:    General: Skin is warm and dry.     Coloration: Skin is not pale.     Findings: No erythema or rash.  Neurological:     Mental Status: He is alert and oriented to person, place, and time.     Cranial Nerves: No cranial nerve deficit.     Motor: No abnormal muscle tone.     Coordination: Coordination normal.     Deep Tendon Reflexes: Reflexes are normal and symmetric. Reflexes normal.  Psychiatric:        Behavior: Behavior normal.        Thought Content: Thought content normal.        Judgment: Judgment normal.  Assessment & Plan:  Well exam. We discussed diet and exercise. Get a CBC and a thyroid panel today since these were not done at his insurance exam. He also has a sinusitis and we will treat this with a Zpack.  Alysia Penna, MD

## 2021-07-04 DIAGNOSIS — M5126 Other intervertebral disc displacement, lumbar region: Secondary | ICD-10-CM

## 2021-07-04 HISTORY — DX: Other intervertebral disc displacement, lumbar region: M51.26

## 2021-07-21 IMAGING — MR MR KNEE*L* W/O CM
4 of 7 series · 22 of 40 positions shown · non-contrast
Comparison: 11/25/2010

CLINICAL DATA: Left knee pain worsening over the last few months.
ACL repair 10 years ago.

EXAM:
MRI OF THE LEFT KNEE WITHOUT CONTRAST
TECHNIQUE: Multiplanar, multisequence MR imaging of the knee was performed. No
intravenous contrast was administered.

[Series 3: T2 fat-sat · axial · 4.0mm · 0.50mm/px · z∈[-90,+45]mm · 7 of 28 slices shown]
[im 1/28]
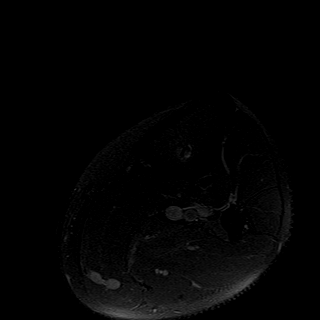
[im 5/28]
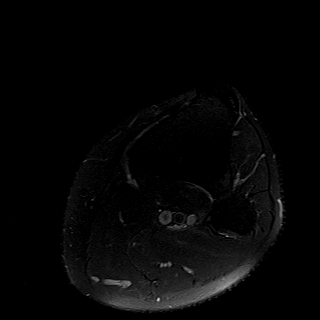
[im 10/28]
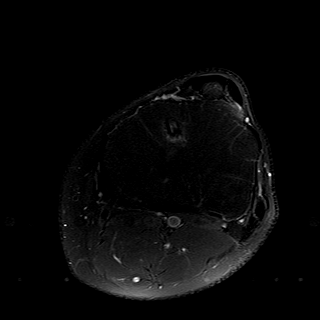
[im 14/28]
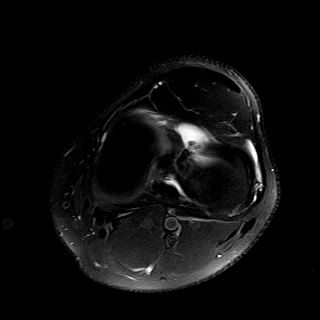
[im 19/28]
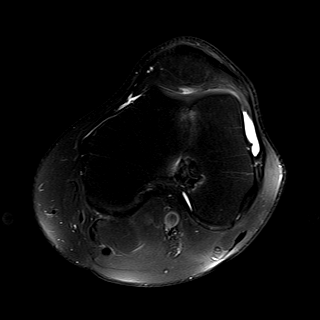
[im 23/28]
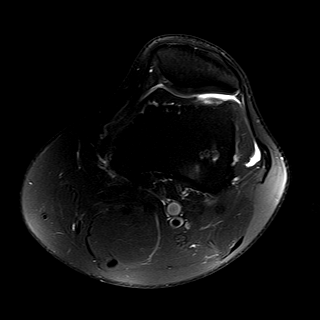
[im 28/28]
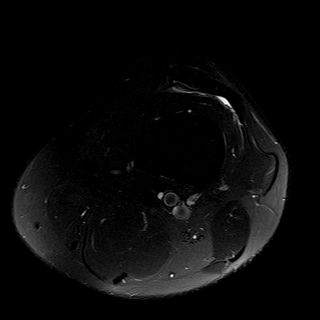

[Series 6: PD fat-sat · coronal · 3.0mm · 0.29mm/px · 7 of 32 slices shown (1 of 3)]
[im 1/32]
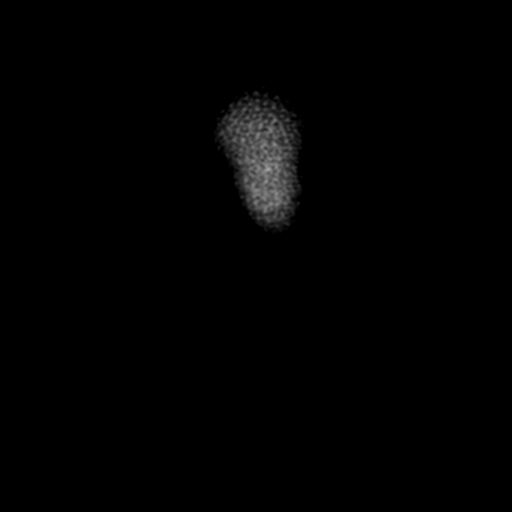
[im 6/32]
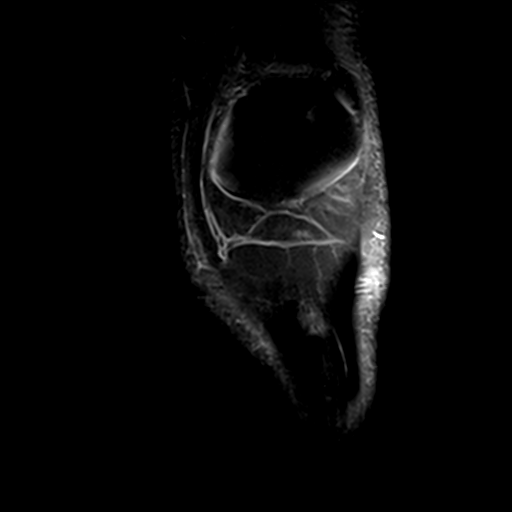
[im 11/32]
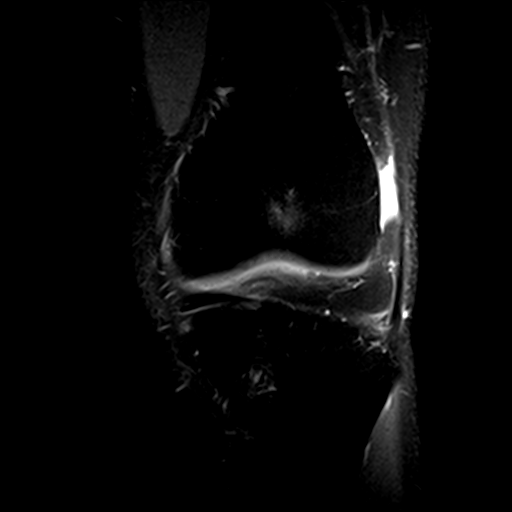
[im 16/32]
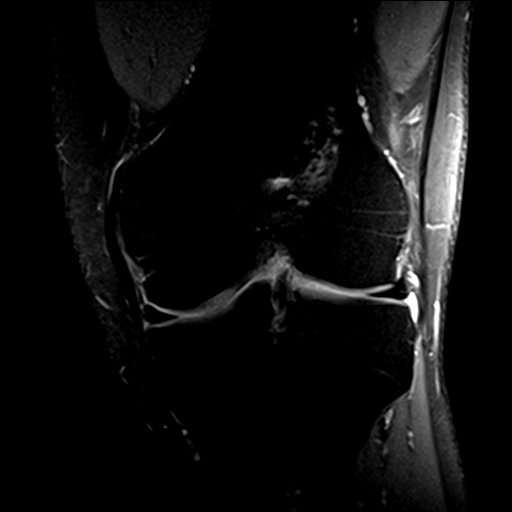
[im 21/32]
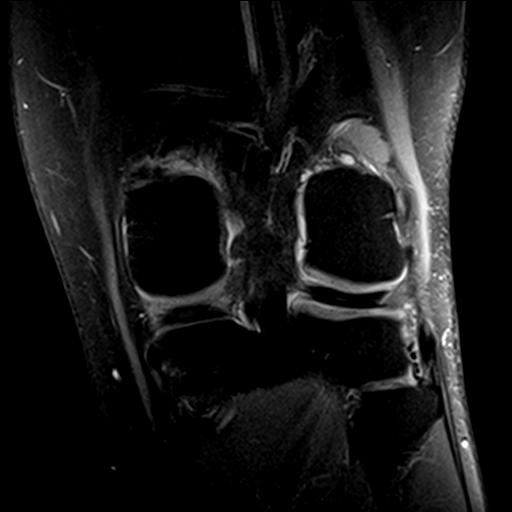
[im 26/32]
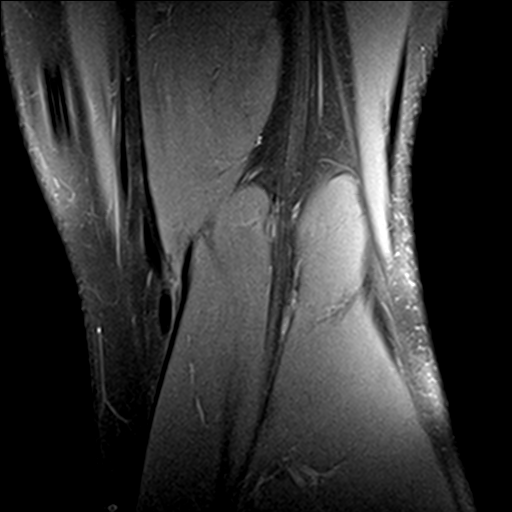
[im 32/32]
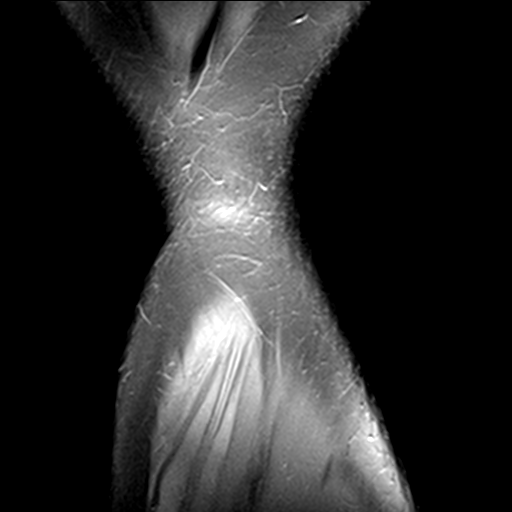

[Series 8: PD fat-sat · sagittal · 3.0mm · 0.29mm/px · 6 of 28 slices shown (2 of 3)]
[im 1/28]
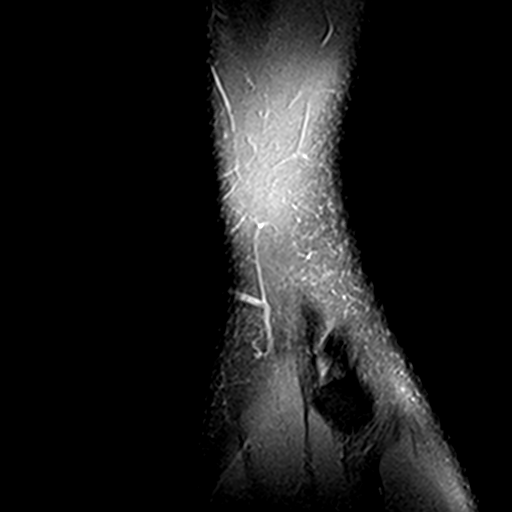
[im 6/28]
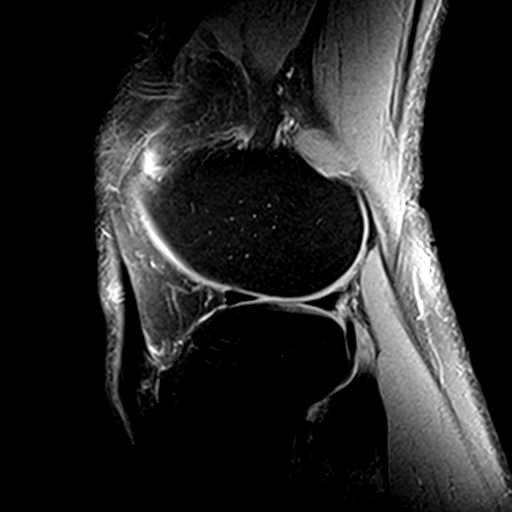
[im 11/28]
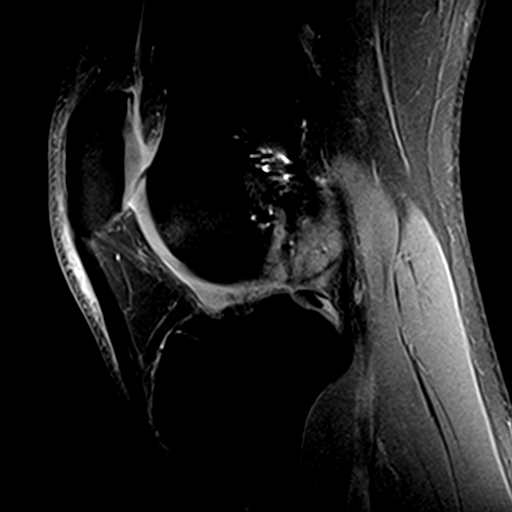
[im 17/28]
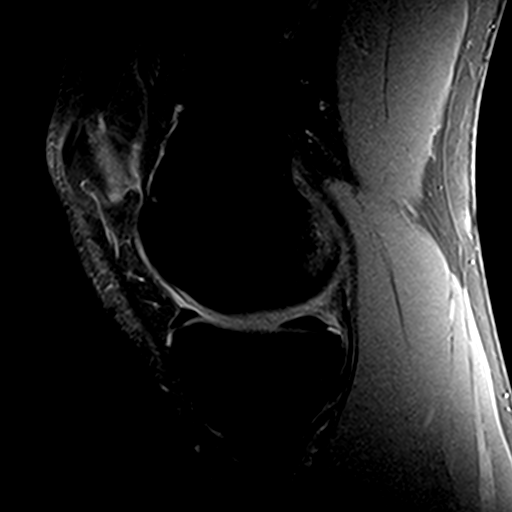
[im 22/28]
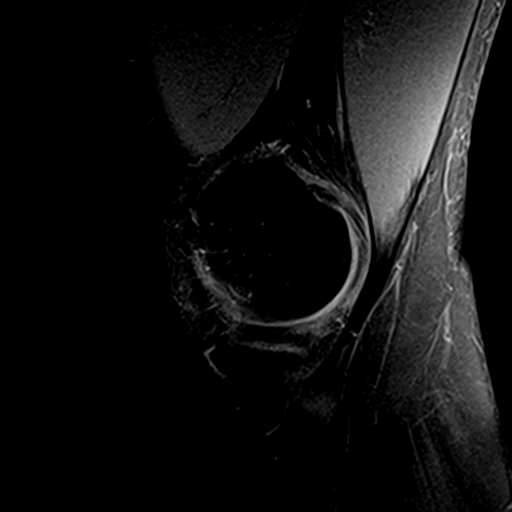
[im 28/28]
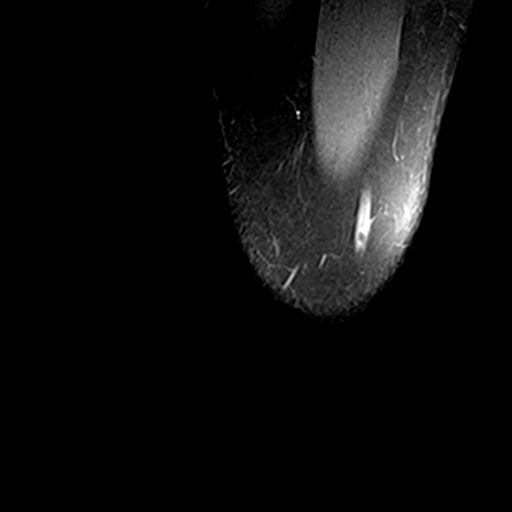

[Series 9: PD fat-sat · oblique · 2.3mm · 0.29mm/px · 2 of 11 slices shown (3 of 3)]
[im 1/11]
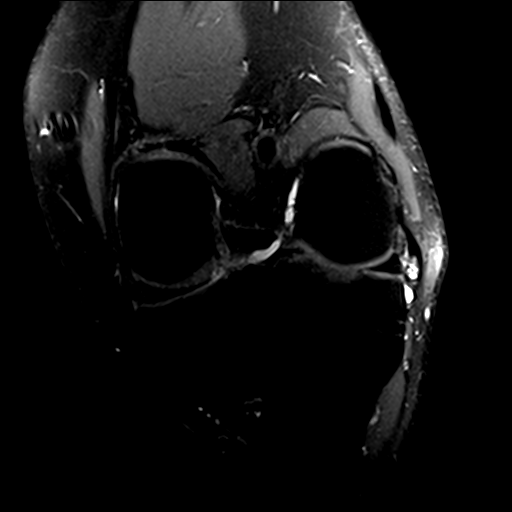
[im 11/11]
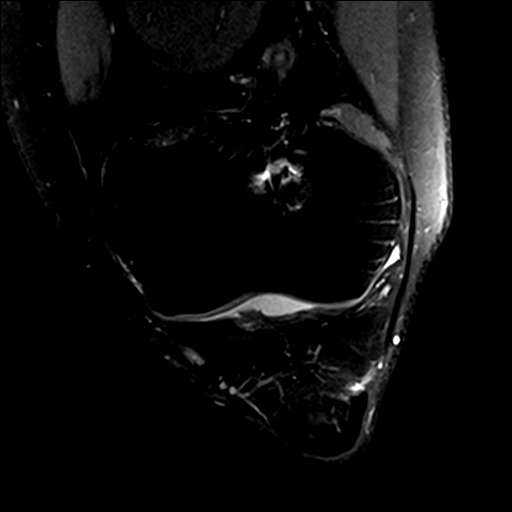

[22 of 40 positions shown; findings below may reference images not displayed]

FINDINGS: MENISCI

Medial meniscus: Accentuated signal at the upper margin of the
periphery of the posterior horn for example on image [DATE] probably
represents degeneration but a well-defined tear is not observed.

Lateral meniscus: Striations in the anterior horn of the lateral
meniscus near the meniscal root is considered within normal limits.

LIGAMENTS

Cruciates: The ACL graft is degenerated and indistinct/amorphous but
not felt to be totally ruptured. PCL intact. No ACL tunnel cyst or
specific complicating feature along the graft attachment.

Collaterals:  Unremarkable

CARTILAGE

Patellofemoral: Mild chondral fissuring along part of the medial
facet on image [DATE]. 8 mm osteochondral lesion of the central femoral
trochlear groove with overlying chondral heterogeneity and
underlying localized marrow edema signal, and slight irregularity of
the cortex.

Medial:  Unremarkable

Lateral:  Unremarkable

Joint: Small focus of suspected metal artifact adjacent to the
posterior horn lateral meniscus on image [DATE] a likely a microscopic
metallic particle. Small knee effusion.

Popliteal Fossa:  Mild distal semimembranosus tendinopathy.

Extensor Mechanism: There is evidence of remote Osgood-Schlatter
disease with I 1.3 by 0.7 by 1.2 cm well corticated ossicle in the
distal patellar tendon adjacent to the tibial tubercle. No
significant abnormal marrow edema in this vicinity. Trace
prepatellar subcutaneous edema. Tibial tubercle-trochlear groove
distance 1.3 cm.

Bones: No significant extra-articular osseous abnormalities
identified.

Other: No supplemental non-categorized findings.
IMPRESSION: 1. The ACL graft is degenerated and indistinct but not felt to be
totally ruptured.
2. 8 mm non-fragmented osteochondral lesion of the central femoral
trochlear groove with overlying chondral heterogeneity and
underlying localized marrow edema signal. Mild chondral fissuring
along part of the medial facet of the patella.
3. Small knee effusion.
4. Mild distal semimembranosus tendinopathy.
5. Prior remote Osgood-Schlatter disease with I 1.3 by 0.7 by 1.2 cm
well corticated ossicle in the distal patellar tendon adjacent to
the tibial tubercle. No marrow edema in this ossicle currently.
Trace prepatellar subcutaneous edema.
6. Accentuated signal at the upper margin of the periphery of the
posterior horn medial meniscus probably represents degeneration but
a well-defined tear is not observed.

## 2021-10-05 ENCOUNTER — Encounter: Payer: Self-pay | Admitting: Family Medicine

## 2021-10-05 DIAGNOSIS — Z209 Contact with and (suspected) exposure to unspecified communicable disease: Secondary | ICD-10-CM

## 2021-10-06 NOTE — Telephone Encounter (Signed)
No need for a nurse visit. I put in lab orders for these tests, so he can just schedule a lab appt  ?

## 2022-01-21 ENCOUNTER — Encounter: Payer: Self-pay | Admitting: Family Medicine

## 2022-01-21 ENCOUNTER — Ambulatory Visit (INDEPENDENT_AMBULATORY_CARE_PROVIDER_SITE_OTHER): Payer: No Typology Code available for payment source | Admitting: Family Medicine

## 2022-01-21 VITALS — BP 98/70 | HR 63 | Temp 98.6°F | Ht 71.5 in | Wt 204.0 lb

## 2022-01-21 DIAGNOSIS — Z209 Contact with and (suspected) exposure to unspecified communicable disease: Secondary | ICD-10-CM

## 2022-01-21 DIAGNOSIS — Z0000 Encounter for general adult medical examination without abnormal findings: Secondary | ICD-10-CM | POA: Diagnosis not present

## 2022-01-21 LAB — CBC WITH DIFFERENTIAL/PLATELET
Basophils Absolute: 0 10*3/uL (ref 0.0–0.1)
Basophils Relative: 0.3 % (ref 0.0–3.0)
Eosinophils Absolute: 0.1 10*3/uL (ref 0.0–0.7)
Eosinophils Relative: 1.4 % (ref 0.0–5.0)
HCT: 43.3 % (ref 39.0–52.0)
Hemoglobin: 14.7 g/dL (ref 13.0–17.0)
Lymphocytes Relative: 31.9 % (ref 12.0–46.0)
Lymphs Abs: 1.3 10*3/uL (ref 0.7–4.0)
MCHC: 33.9 g/dL (ref 30.0–36.0)
MCV: 88.3 fl (ref 78.0–100.0)
Monocytes Absolute: 0.3 10*3/uL (ref 0.1–1.0)
Monocytes Relative: 6.2 % (ref 3.0–12.0)
Neutro Abs: 2.5 10*3/uL (ref 1.4–7.7)
Neutrophils Relative %: 60.2 % (ref 43.0–77.0)
Platelets: 210 10*3/uL (ref 150.0–400.0)
RBC: 4.9 Mil/uL (ref 4.22–5.81)
RDW: 13.3 % (ref 11.5–15.5)
WBC: 4.1 10*3/uL (ref 4.0–10.5)

## 2022-01-21 LAB — LIPID PANEL
Cholesterol: 226 mg/dL — ABNORMAL HIGH (ref 0–200)
HDL: 56.6 mg/dL (ref >39.00)
LDL Cholesterol: 151 mg/dL — ABNORMAL HIGH (ref 0–99)
NonHDL: 169.05
Total CHOL/HDL Ratio: 4
Triglycerides: 90 mg/dL (ref 0.0–149.0)
VLDL: 18 mg/dL (ref 0.0–40.0)

## 2022-01-21 LAB — HEPATIC FUNCTION PANEL
ALT: 20 U/L (ref 0–53)
AST: 20 U/L (ref 0–37)
Albumin: 4.8 g/dL (ref 3.5–5.2)
Alkaline Phosphatase: 63 U/L (ref 39–117)
Bilirubin, Direct: 0.2 mg/dL (ref 0.0–0.3)
Total Bilirubin: 1.3 mg/dL — ABNORMAL HIGH (ref 0.2–1.2)
Total Protein: 6.9 g/dL (ref 6.0–8.3)

## 2022-01-21 LAB — BASIC METABOLIC PANEL
BUN: 13 mg/dL (ref 6–23)
CO2: 28 mEq/L (ref 19–32)
Calcium: 9.7 mg/dL (ref 8.4–10.5)
Chloride: 104 mEq/L (ref 96–112)
Creatinine, Ser: 0.98 mg/dL (ref 0.40–1.50)
GFR: 90.39 mL/min (ref >60.00)
Glucose, Bld: 97 mg/dL (ref 70–99)
Potassium: 4.6 mEq/L (ref 3.5–5.1)
Sodium: 140 mEq/L (ref 135–145)

## 2022-01-21 LAB — HEMOGLOBIN A1C: Hgb A1c MFr Bld: 5.6 % (ref 4.6–6.5)

## 2022-01-21 LAB — TSH: TSH: 3.2 u[IU]/mL (ref 0.35–5.50)

## 2022-01-21 NOTE — Progress Notes (Signed)
   Subjective:    Patient ID: Peter Macias, male    DOB: Feb 25, 1971, 51 y.o.   MRN: [id number]  HPI Here for a well exam. He feels fine. He manages his GERD with diet, though he takes Pepcid occasionally.    Review of Systems  Constitutional: Negative.   HENT: Negative.    Eyes: Negative.   Respiratory: Negative.    Cardiovascular: Negative.   Gastrointestinal: Negative.   Genitourinary: Negative.   Musculoskeletal: Negative.   Skin: Negative.   Neurological: Negative.   Psychiatric/Behavioral: Negative.         Objective:   Physical Exam Constitutional:      General: He is not in acute distress.    Appearance: Normal appearance. He is well-developed. He is not diaphoretic.  HENT:     Head: Normocephalic and atraumatic.     Right Ear: External ear normal.     Left Ear: External ear normal.     Nose: Nose normal.     Mouth/Throat:     Pharynx: No oropharyngeal exudate.  Eyes:     General: No scleral icterus.       Right eye: No discharge.        Left eye: No discharge.     Conjunctiva/sclera: Conjunctivae normal.     Pupils: Pupils are equal, round, and reactive to light.  Neck:     Thyroid: No thyromegaly.     Vascular: No JVD.     Trachea: No tracheal deviation.  Cardiovascular:     Rate and Rhythm: Normal rate and regular rhythm.     Heart sounds: Normal heart sounds. No murmur heard.    No friction rub. No gallop.  Pulmonary:     Effort: Pulmonary effort is normal. No respiratory distress.     Breath sounds: Normal breath sounds. No wheezing or rales.  Chest:     Chest wall: No tenderness.  Abdominal:     General: Bowel sounds are normal. There is no distension.     Palpations: Abdomen is soft. There is no mass.     Tenderness: There is no abdominal tenderness. There is no guarding or rebound.  Genitourinary:    Penis: Normal. No tenderness.      Testes: Normal.  Musculoskeletal:        General: No tenderness. Normal range of motion.     Cervical back:  Neck supple.  Lymphadenopathy:     Cervical: No cervical adenopathy.  Skin:    General: Skin is warm and dry.     Coloration: Skin is not pale.     Findings: No erythema or rash.  Neurological:     Mental Status: He is alert and oriented to person, place, and time.     Cranial Nerves: No cranial nerve deficit.     Motor: No abnormal muscle tone.     Coordination: Coordination normal.     Deep Tendon Reflexes: Reflexes are normal and symmetric. Reflexes normal.  Psychiatric:        Behavior: Behavior normal.        Thought Content: Thought content normal.        Judgment: Judgment normal.           Assessment & Plan:  Well exam. We discussed diet and exercise. Get fasting labs. Set up a colonoscopy. Per his request we will screen for Hep. B and C, as well as TB. Alysia Penna, MD

## 2022-01-25 LAB — QUANTIFERON-TB GOLD PLUS
Mitogen-NIL: >10 IU/mL
NIL: 0.05 IU/mL
QuantiFERON-TB Gold Plus: NEGATIVE
TB1-NIL: 0.01 IU/mL
TB2-NIL: 0.01 IU/mL

## 2022-01-25 LAB — HEPATITIS C ANTIBODY: Hepatitis C Ab: NONREACTIVE

## 2022-01-25 LAB — HEPATITIS B SURFACE ANTIBODY,QUALITATIVE: Hep B S Ab: REACTIVE — AB

## 2022-01-25 LAB — HEPATITIS B SURFACE ANTIGEN: Hepatitis B Surface Ag: NONREACTIVE

## 2022-04-08 ENCOUNTER — Ambulatory Visit (AMBULATORY_SURGERY_CENTER): Payer: Self-pay

## 2022-04-08 VITALS — Ht 71.5 in | Wt 202.0 lb

## 2022-04-08 DIAGNOSIS — Z1211 Encounter for screening for malignant neoplasm of colon: Secondary | ICD-10-CM

## 2022-04-08 MED ORDER — NA SULFATE-K SULFATE-MG SULF 17.5-3.13-1.6 GM/177ML PO SOLN: 1.0000 | ORAL | 0 refills | Status: DC

## 2022-04-08 NOTE — Progress Notes (Signed)
No egg or soy allergy known to patient  No issues known to pt with past sedation with any surgeries or procedures Patient denies ever being told they had issues or difficulty with intubation  No FH of Malignant Hyperthermia Pt is not on diet pills Pt is not on  home 02  Pt is not on blood thinners  Pt denies issues with constipation  No A fib or A flutter Have any cardiac testing pending--denied Pt instructed to use [employer] or GoodRx for a price reduction on prep   

## 2022-04-15 ENCOUNTER — Telehealth: Payer: Self-pay | Admitting: Gastroenterology

## 2022-04-15 ENCOUNTER — Other Ambulatory Visit: Payer: Self-pay | Admitting: *Deleted

## 2022-04-15 NOTE — Telephone Encounter (Signed)
Inbound call from patient , states he want to speak with a nurse regarding a "Pre -medication " for procedure coming up , patient said he wont do it want a different want.decline to give me more information.Please advise

## 2022-04-15 NOTE — Telephone Encounter (Signed)
Returned patient call.Patient has severe sensitivity to any product that contains Sulfate/Sulfite  will not be able to tolerate Suprep. Substituted for Miralax, new instructions forwarded through [facility].

## 2022-04-22 ENCOUNTER — Encounter: Payer: Self-pay | Admitting: Gastroenterology

## 2022-05-02 ENCOUNTER — Encounter: Payer: Self-pay | Admitting: Gastroenterology

## 2022-05-02 ENCOUNTER — Ambulatory Visit (AMBULATORY_SURGERY_CENTER): Payer: No Typology Code available for payment source | Admitting: Gastroenterology

## 2022-05-02 VITALS — BP 100/66 | HR 51 | Temp 97.1°F | Resp 8 | Ht 71.5 in | Wt 202.0 lb

## 2022-05-02 DIAGNOSIS — D122 Benign neoplasm of ascending colon: Secondary | ICD-10-CM | POA: Diagnosis not present

## 2022-05-02 DIAGNOSIS — Z1211 Encounter for screening for malignant neoplasm of colon: Secondary | ICD-10-CM | POA: Diagnosis present

## 2022-05-02 HISTORY — PX: COLONOSCOPY: SHX174

## 2022-05-02 MED ORDER — SODIUM CHLORIDE 0.9 % IV SOLN: 500.0000 mL | Freq: Once | INTRAVENOUS | Status: DC

## 2022-05-02 NOTE — Progress Notes (Signed)
Pt resting comfortably. VSS. Airway intact. SBAR complete to RN. All questions answered.   

## 2022-05-02 NOTE — Progress Notes (Signed)
Called to room to assist during endoscopic procedure.  Patient ID and intended procedure confirmed with present staff. Received instructions for my participation in the procedure from the performing physician.  

## 2022-05-02 NOTE — Patient Instructions (Signed)
Handout on diverticulosis and polyps given to patient. Await pathology results. Resume previous diet, high-fiber diet encouraged, continue present medications. No aspirin, ibuprofen, naproxen, or other non-steroidal anti-inflammatory medications for 2 weeks after polyp removal. Repeat colonoscopy, likely in 3 years, after pathology results for surveillance.   YOU HAD AN ENDOSCOPIC PROCEDURE TODAY AT [facility]:   Refer to the procedure report that was given to you for any specific questions about what was found during the examination.  If the procedure report does not answer your questions, please call your gastroenterologist to clarify.  If you requested that your care partner not be given the details of your procedure findings, then the procedure report has been included in a sealed envelope for you to review at your convenience later.  YOU SHOULD EXPECT: Some feelings of bloating in the abdomen. Passage of more gas than usual.  Walking can help get rid of the air that was put into your GI tract during the procedure and reduce the bloating. If you had a lower endoscopy (such as a colonoscopy or flexible sigmoidoscopy) you may notice spotting of blood in your stool or on the toilet paper. If you underwent a bowel prep for your procedure, you may not have a normal bowel movement for a few days.  Please Note:  You might notice some irritation and congestion in your nose or some drainage.  This is from the oxygen used during your procedure.  There is no need for concern and it should clear up in a day or so.  SYMPTOMS TO REPORT IMMEDIATELY:  Following lower endoscopy (colonoscopy or flexible sigmoidoscopy):  Excessive amounts of blood in the stool  Significant tenderness or worsening of abdominal pains  Swelling of the abdomen that is new, acute  Fever of 100F or higher   For urgent or emergent issues, a gastroenterologist can be reached at any hour by calling [phone]  [phone]. Do not use MyChart messaging for urgent concerns.    DIET:  We do recommend a small meal at first, but then you may proceed to your regular diet.  Drink plenty of fluids but you should avoid alcoholic beverages for 24 hours.  ACTIVITY:  You should plan to take it easy for the rest of today and you should NOT DRIVE or use heavy machinery until tomorrow (because of the sedation medicines used during the test).    FOLLOW UP: Our staff will call the number listed on your records the next business day following your procedure.  We will call around 7:15- 8:00 am to check on you and address any questions or concerns that you may have regarding the information given to you following your procedure. If we do not reach you, we will leave a message.     If any biopsies were taken you will be contacted by phone or by letter within the next 1-3 weeks.  Please call us at [phone] if you have not heard about the biopsies in 3 weeks.    SIGNATURES/CONFIDENTIALITY: You and/or your care partner have signed paperwork which will be entered into your electronic medical record.  These signatures attest to the fact that that the information above on your After Visit Summary has been reviewed and is understood.  Full responsibility of the confidentiality of this discharge information lies with you and/or your care-partner.

## 2022-05-02 NOTE — Op Note (Signed)
[place] Patient Name: Peter Macias Procedure Date: 05/02/2022 7:22 AM MRN: [id number] Endoscopist: Ladene Artist , MD, [id number] Age: 51 Referring MD:  Date of Birth: [birth year] Gender: Male Account #: [id number] Procedure:                Colonoscopy Indications:              Screening for colorectal malignant neoplasm Medicines:                Monitored Anesthesia Care Procedure:                Pre-Anesthesia Assessment:                           - Prior to the procedure, a History and Physical                            was performed, and patient medications and                            allergies were reviewed. The patient's tolerance of                            previous anesthesia was also reviewed. The risks                            and benefits of the procedure and the sedation                            options and risks were discussed with the patient.                            All questions were answered, and informed consent                            was obtained. Prior Anticoagulants: The patient has                            taken no anticoagulant or antiplatelet agents. ASA                            Grade Assessment: I - A normal, healthy patient.                            After reviewing the risks and benefits, the patient                            was deemed in satisfactory condition to undergo the                            procedure.                           After obtaining informed consent, the colonoscope  was passed under direct vision. Throughout the                            procedure, the patient's blood pressure, pulse, and                            oxygen saturations were monitored continuously. The                            Olympus CF-HQ190L [id number]) Colonoscope was                            introduced through the anus and advanced to the the                            cecum, identified by  appendiceal orifice and                            ileocecal valve. The ileocecal valve, appendiceal                            orifice, and rectum were photographed. The quality                            of the bowel preparation was good. The colonoscopy                            was performed without difficulty. The patient                            tolerated the procedure well. Scope In: 8:04:14 AM Scope Out: 8:25:41 AM Scope Withdrawal Time: 0 hours 16 minutes 52 seconds  Total Procedure Duration: 0 hours 21 minutes 27 seconds  Findings:                 The perianal and digital rectal examinations were                            normal.                           A 16 mm polyp was found in the proximal ascending                            colon. The polyp was semi-pedunculated. The polyp                            was removed with a hot snare. Resection and                            retrieval were complete.                           A few small-mouthed diverticula were found in the  descending colon and transverse colon.                           Internal hemorrhoids were found during                            retroflexion. The hemorrhoids were small and Grade                            I (internal hemorrhoids that do not prolapse).                           The exam was otherwise without abnormality on                            direct and retroflexion views. Complications:            No immediate complications. Estimated blood loss:                            None. Estimated Blood Loss:     Estimated blood loss: none. Impression:               - One 16 mm polyp in the proximal ascending colon,                            removed with a hot snare. Resected and retrieved.                           - Mild diverticulosis in the descending colon and                            in the transverse colon.                           - Internal hemorrhoids.                            - The examination was otherwise normal on direct                            and retroflexion views. Recommendation:           - Repeat colonoscopy, likely in 3 years, after                            studies are complete for surveillance based on                            pathology results.                           - Patient has a contact number available for                            emergencies. The signs and symptoms of potential  delayed complications were discussed with the                            patient. Return to normal activities tomorrow.                            Written discharge instructions were provided to the                            patient.                           - High fiber diet.                           - Continue present medications.                           - Await pathology results.                           - No aspirin, ibuprofen, naproxen, or other                            non-steroidal anti-inflammatory drugs for 2 weeks                            after polyp removal. Ladene Artist, MD 05/02/2022 8:31:05 AM This report has been signed electronically.

## 2022-05-02 NOTE — Progress Notes (Signed)
History & Physical  Primary Care Physician:  Laurey Morale, MD Primary Gastroenterologist: Lucio Edward, MD  CHIEF COMPLAINT:  CRC screening   HPI: Peter Macias is a 51 y.o. male average risk CRC screening for colonoscopy.   Past Medical History:  Diagnosis Date   Degenerative disk disease    in C1-2, also in L3-4 and L4-5    Eczema    Environmental allergies    GERD (gastroesophageal reflux disease)    Hemorrhoids, external    Herniated lumbar intervertebral disc 2023   TMJ (temporomandibular joint syndrome)     Past Surgical History:  Procedure Laterality Date   ANTERIOR CRUCIATE LIGAMENT REPAIR  2012   per Dr. Amada Jupiter, used allograft    TONSILLECTOMY      Prior to Admission medications   Medication Sig Start Date End Date Taking? Authorizing Provider  famotidine (PEPCID) 20 MG tablet  11/07/18  Yes [provider]  ibuprofen (ADVIL,MOTRIN) 200 MG tablet Take 200 mg by mouth as needed.    [provider]    Current Outpatient Medications  Medication Sig Dispense Refill   famotidine (PEPCID) 20 MG tablet      ibuprofen (ADVIL,MOTRIN) 200 MG tablet Take 200 mg by mouth as needed.     Current Facility-Administered Medications  Medication Dose Route Frequency Provider Last Rate Last Admin   0.9 %  sodium chloride infusion  500 mL Intravenous Once Ladene Artist, MD        Allergies as of 05/02/2022 - Review Complete 05/02/2022  Allergen Reaction Noted   Sulfate Other (See Comments) 04/03/2014   Sulfites Other (See Comments) 06/03/2014    Family History  Problem Relation Age of Onset   Arthritis Mother    Depression Mother    Melanoma Father    Colon polyps Paternal Grandfather    Colon cancer Neg Hx    Esophageal cancer Neg Hx    Stomach cancer Neg Hx    Rectal cancer Neg Hx     Social History   Socioeconomic History   Marital status: Married    Spouse name: Not on file   Number of children: Not on file   Years of education:  Not on file   Highest education level: Not on file  Occupational History   Not on file  Tobacco Use   Smoking status: Former   Smokeless tobacco: Never  Substance and Sexual Activity   Alcohol use: Yes    Alcohol/week: 0.0 standard drinks of alcohol    Comment: occ   Drug use: No   Sexual activity: Not on file  Other Topics Concern   Not on file  Social History Narrative   Not on file   Social Determinants of Health   Financial Resource Strain: Not on file  Food Insecurity: Not on file  Transportation Needs: Not on file  Physical Activity: Not on file  Stress: Not on file  Social Connections: Not on file  Intimate Partner Violence: Not on file    Review of Systems:  All systems reviewed were negative except where noted in HPI.   Physical Exam: General:  Alert, well-developed, in NAD Head:  Normocephalic and atraumatic. Eyes:  Sclera clear, no icterus.   Conjunctiva pink. Ears:  Normal auditory acuity. Mouth:  No deformity or lesions.  Neck:  Supple; no masses . Lungs:  Clear throughout to auscultation.   No wheezes, crackles, or rhonchi. No acute distress. Heart:  Regular rate and rhythm; no murmurs.  Abdomen:  Soft, nondistended, nontender. No masses, hepatomegaly. No obvious masses.  Normal bowel .    Rectal:  Deferred   Msk:  Symmetrical without gross deformities.. Pulses:  Normal pulses noted. Extremities:  Without edema. Neurologic:  Alert and  oriented x4;  grossly normal neurologically. Skin:  Intact without significant lesions or rashes. Cervical Nodes:  No significant cervical adenopathy. Psych:  Alert and cooperative. Normal mood and affect.   Impression / Plan:   Average risk CRC screening with colonoscopy.  Pricilla Riffle. Fuller Plan  05/02/2022, 7:55 AM See Shea Evans, [facility], to contact our on call provider

## 2022-05-02 NOTE — Progress Notes (Signed)
Pt's states no medical or surgical changes since previsit or office visit. 

## 2022-05-03 ENCOUNTER — Telehealth: Payer: Self-pay

## 2022-05-03 NOTE — Telephone Encounter (Signed)
  Follow up Call-     05/02/2022    7:21 AM  Call back number  Post procedure Call Back phone  # [phone]  Permission to leave phone message Yes     Patient questions:  Do you have a fever, pain , or abdominal swelling? No. Pain Score  0 *  Have you tolerated food without any problems? Yes.    Have you been able to return to your normal activities? Yes.    Do you have any questions about your discharge instructions: Diet   No. Medications  No. Follow up visit  No.  Do you have questions or concerns about your Care? No.  Actions: * If pain score is 4 or above: No action needed, pain <4.

## 2022-05-12 ENCOUNTER — Encounter: Payer: Self-pay | Admitting: Gastroenterology

## 2022-10-21 ENCOUNTER — Encounter: Payer: Self-pay | Admitting: Family Medicine

## 2022-10-21 ENCOUNTER — Ambulatory Visit: Payer: No Typology Code available for payment source | Admitting: Family Medicine

## 2022-10-21 VITALS — BP 110/80 | HR 78 | Temp 97.9°F | Wt 209.9 lb

## 2022-10-21 DIAGNOSIS — S80261A Insect bite (nonvenomous), right knee, initial encounter: Secondary | ICD-10-CM

## 2022-10-21 DIAGNOSIS — W57XXXA Bitten or stung by nonvenomous insect and other nonvenomous arthropods, initial encounter: Secondary | ICD-10-CM

## 2022-10-21 MED ORDER — DOXYCYCLINE HYCLATE 100 MG PO CAPS: 100.0000 mg | ORAL_CAPSULE | Freq: Two times a day (BID) | ORAL | 0 refills | Status: AC

## 2022-10-21 MED ORDER — MELOXICAM 15 MG PO TABS: 15.0000 mg | ORAL_TABLET | Freq: Every day | ORAL | 0 refills | Status: DC

## 2022-10-21 NOTE — Progress Notes (Signed)
   Subjective:    Patient ID: Peter Macias, male    DOB: 08-26-1970, 52 y.o.   MRN: [id number]  HPI Here to check an apparent insect bite behind the right knee. He discovered this about 2 weeks ago. It was very inflamed at first but now this is calming down. No fever or red streaking on the leg.    Review of Systems  Constitutional: Negative.   Respiratory: Negative.    Cardiovascular: Negative.   Musculoskeletal:  Positive for arthralgias.  Skin:  Positive for wound.       Objective:   Physical Exam Constitutional:      Appearance: Normal appearance.  Cardiovascular:     Rate and Rhythm: Normal rate and regular rhythm.     Pulses: Normal pulses.     Heart sounds: Normal heart sounds.  Pulmonary:     Effort: Pulmonary effort is normal.     Breath sounds: Normal breath sounds.  Skin:    Comments: There is a 1 cm red nodular lesion in the right popliteal space. This is slightly tender. There is a punctate mark in the center of it   Neurological:     Mental Status: He is alert.           Assessment & Plan:  Insect bite, likely from a tick. Weill cover this with 10 days of Doxycycline. Recheck as needed. Gershon Crane, MD

## 2023-01-27 ENCOUNTER — Encounter: Payer: No Typology Code available for payment source | Admitting: Family Medicine

## 2023-02-10 ENCOUNTER — Encounter: Payer: No Typology Code available for payment source | Admitting: Family Medicine

## 2023-02-16 ENCOUNTER — Encounter (INDEPENDENT_AMBULATORY_CARE_PROVIDER_SITE_OTHER): Payer: Self-pay

## 2023-02-21 ENCOUNTER — Ambulatory Visit (INDEPENDENT_AMBULATORY_CARE_PROVIDER_SITE_OTHER): Payer: No Typology Code available for payment source | Admitting: Sports Medicine

## 2023-02-21 VITALS — BP 122/84 | Ht 71.0 in | Wt 210.0 lb

## 2023-02-21 DIAGNOSIS — M25561 Pain in right knee: Secondary | ICD-10-CM | POA: Diagnosis not present

## 2023-02-21 DIAGNOSIS — G8929 Other chronic pain: Secondary | ICD-10-CM | POA: Diagnosis not present

## 2023-02-21 DIAGNOSIS — M25461 Effusion, right knee: Secondary | ICD-10-CM

## 2023-02-21 MED ORDER — MELOXICAM 15 MG PO TABS: ORAL_TABLET | ORAL | 0 refills | Status: AC

## 2023-02-21 MED ORDER — METHYLPREDNISOLONE ACETATE 40 MG/ML IJ SUSP
40.0000 mg | Freq: Once | INTRAMUSCULAR | Status: AC
  Administered 2023-02-21: 40 mg via INTRA_ARTICULAR

## 2023-02-21 NOTE — Progress Notes (Addendum)
   Subjective:    Patient ID: Peter Macias, male    DOB: 01-01-1971, 52 y.o.   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  He initially noticed pain and swelling approximately 3 months ago after mowing for an extended period of time.  Symptoms eventually resolved only to return a little over 2 weeks ago.  He has recently taken up running again and believes that may be a contributing factor in addition to his mowing.  His pain is diffuse throughout the knee.  He has been taking meloxicam which has been helpful.  He also has a compression sleeve.  He is status post remote ACL reconstruction on the left knee but denies any previous surgeries on the right knee.  He does endorse some clicking in the right knee but it is chronic and not necessarily painful.  He also believes a contributing factor is a lumbar disc herniation that he suffered last summer.  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  He has done an excellent job of rehabbing his left leg but was obviously favoring the right leg during this time.  He has not had any imaging.  His last MRI of the left knee was in 2021 and it did show a small peripheral meniscal tear as well as a small osteochondral defect.    Review of Systems As above    Objective:   Physical Exam  Well-developed, well-nourished.  No acute distress  Right knee: Right quadriceps measures 50.5 cm.  Left quadriceps measures 50.0 cm.  Range of motion is 0 to about 100 degrees.  Trace effusion.  Slight tenderness to palpation along the medial joint line no tenderness along the lateral joint line.  Negative Thessaly's.  Negative McMurray's.  Knee is stable to anterior drawer and posterior drawer testing.  Stable to valgus and varus stressing.  Neurovascularly intact distally.  MSK ultrasound of the right knee shows a small joint effusion but otherwise unremarkable.  No  obvious Baker's cyst.  Visualized medial and lateral menisci are unremarkable and no significant degenerative changes seen.      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  He tolerated this without difficulty.  We will refill his meloxicam with instructions to take 15 mg daily for 7 days then as needed.  He will wear his compression sleeve while at work.  We will also get x-rays of his right knee.  We discussed merits of an MRI if symptoms do not improve with today's cortisone injection.  I will follow-up with him with the imaging results once available.  Consent obtained and verified. Time-out conducted. Noted no overlying erythema, induration, or other signs of local infection. Skin prepped in a sterile fashion. Topical analgesic spray: Ethyl chloride. Joint: Right knee Needle: 25-gauge 1.5 inch Completed without difficulty. Meds: 3 cc 1% Xylocaine, 1 cc (40 mg) Depo-Medrol    This note was dictated using Dragon naturally speaking software and may contain errors in syntax, spelling, or content which have not been identified prior to signing this note.   Addendum: 02/28/2023 X-ray reviewed.  No significant degenerative changes seen.  Proceed with MRI as scheduled to rule out meniscus tear.

## 2023-02-24 ENCOUNTER — Ambulatory Visit
Admission: RE | Admit: 2023-02-24 | Discharge: 2023-02-24 | Disposition: A | Discharge status: Home / Self Care | Payer: No Typology Code available for payment source | Source: Ambulatory Visit | Attending: Sports Medicine | Admitting: Sports Medicine

## 2023-02-24 DIAGNOSIS — G8929 Other chronic pain: Secondary | ICD-10-CM

## 2023-03-10 ENCOUNTER — Encounter: Payer: Self-pay | Admitting: Family Medicine

## 2023-03-10 ENCOUNTER — Ambulatory Visit (INDEPENDENT_AMBULATORY_CARE_PROVIDER_SITE_OTHER): Payer: No Typology Code available for payment source | Admitting: Family Medicine

## 2023-03-10 ENCOUNTER — Other Ambulatory Visit: Payer: No Typology Code available for payment source

## 2023-03-10 VITALS — BP 102/76 | HR 62 | Temp 98.2°F | Ht 71.5 in | Wt 208.0 lb

## 2023-03-10 DIAGNOSIS — Z0000 Encounter for general adult medical examination without abnormal findings: Secondary | ICD-10-CM

## 2023-03-10 LAB — HEPATIC FUNCTION PANEL
ALT: 27 U/L (ref 0–53)
AST: 20 U/L (ref 0–37)
Albumin: 4.6 g/dL (ref 3.5–5.2)
Alkaline Phosphatase: 67 U/L (ref 39–117)
Bilirubin, Direct: 0.3 mg/dL (ref 0.0–0.3)
Total Bilirubin: 1.9 mg/dL — ABNORMAL HIGH (ref 0.2–1.2)
Total Protein: 7.2 g/dL (ref 6.0–8.3)

## 2023-03-10 LAB — CBC WITH DIFFERENTIAL/PLATELET
Basophils Absolute: 0 10*3/uL (ref 0.0–0.1)
Basophils Relative: 0.4 % (ref 0.0–3.0)
Eosinophils Absolute: 0.1 10*3/uL (ref 0.0–0.7)
Eosinophils Relative: 1.1 % (ref 0.0–5.0)
HCT: 46.7 % (ref 39.0–52.0)
Hemoglobin: 15.2 g/dL (ref 13.0–17.0)
Lymphocytes Relative: 29.4 % (ref 12.0–46.0)
Lymphs Abs: 1.7 10*3/uL (ref 0.7–4.0)
MCHC: 32.6 g/dL (ref 30.0–36.0)
MCV: 90.3 fl (ref 78.0–100.0)
Monocytes Absolute: 0.5 10*3/uL (ref 0.1–1.0)
Monocytes Relative: 7.8 % (ref 3.0–12.0)
Neutro Abs: 3.6 10*3/uL (ref 1.4–7.7)
Neutrophils Relative %: 61.3 % (ref 43.0–77.0)
Platelets: 239 10*3/uL (ref 150.0–400.0)
RBC: 5.17 Mil/uL (ref 4.22–5.81)
RDW: 13.3 % (ref 11.5–15.5)
WBC: 5.8 10*3/uL (ref 4.0–10.5)

## 2023-03-10 LAB — BASIC METABOLIC PANEL WITH GFR
BUN: 13 mg/dL (ref 6–23)
CO2: 30 meq/L (ref 19–32)
Calcium: 10 mg/dL (ref 8.4–10.5)
Chloride: 101 meq/L (ref 96–112)
Creatinine, Ser: 1 mg/dL (ref 0.40–1.50)
GFR: 87.53 mL/min (ref >60.00)
Glucose, Bld: 84 mg/dL (ref 70–99)
Potassium: 4.5 meq/L (ref 3.5–5.1)
Sodium: 141 meq/L (ref 135–145)

## 2023-03-10 LAB — TSH: TSH: 3.13 u[IU]/mL (ref 0.35–5.50)

## 2023-03-10 LAB — LIPID PANEL
Cholesterol: 233 mg/dL — ABNORMAL HIGH (ref 0–200)
HDL: 61.4 mg/dL (ref >39.00)
LDL Cholesterol: 148 mg/dL — ABNORMAL HIGH (ref 0–99)
NonHDL: 171.17
Total CHOL/HDL Ratio: 4
Triglycerides: 117 mg/dL (ref 0.0–149.0)
VLDL: 23.4 mg/dL (ref 0.0–40.0)

## 2023-03-10 LAB — HEMOGLOBIN A1C: Hgb A1c MFr Bld: 5.6 % (ref 4.6–6.5)

## 2023-03-10 LAB — PSA: PSA: 1.01 ng/mL (ref 0.10–4.00)

## 2023-03-10 NOTE — Progress Notes (Signed)
Subjective:    Patient ID: Peter Macias, male    DOB: Jul 28, 1970, 52 y.o.   MRN: [id number]  HPI Here for a well exam. He feels fine except for some pain and swelling in the right knee. No recent trauma. He is seeing Dr. Frazier Butt for this, and he has an upcoming MRI. He has stopped running and uses an elliptical bike instead.    Review of Systems  Constitutional: Negative.   HENT: Negative.    Eyes: Negative.   Respiratory: Negative.    Cardiovascular: Negative.   Gastrointestinal: Negative.   Genitourinary: Negative.   Musculoskeletal:  Positive for arthralgias.  Skin: Negative.   Neurological: Negative.   Psychiatric/Behavioral: Negative.         Objective:   Physical Exam Constitutional:      General: He is not in acute distress.    Appearance: Normal appearance. He is well-developed. He is not diaphoretic.  HENT:     Head: Normocephalic and atraumatic.     Right Ear: External ear normal.     Left Ear: External ear normal.     Nose: Nose normal.     Mouth/Throat:     Pharynx: No oropharyngeal exudate.  Eyes:     General: No scleral icterus.       Right eye: No discharge.        Left eye: No discharge.     Conjunctiva/sclera: Conjunctivae normal.     Pupils: Pupils are equal, round, and reactive to light.  Neck:     Thyroid: No thyromegaly.     Vascular: No JVD.     Trachea: No tracheal deviation.  Cardiovascular:     Rate and Rhythm: Normal rate and regular rhythm.     Pulses: Normal pulses.     Heart sounds: Normal heart sounds. No murmur heard.    No friction rub. No gallop.  Pulmonary:     Effort: Pulmonary effort is normal. No respiratory distress.     Breath sounds: Normal breath sounds. No wheezing or rales.  Chest:     Chest wall: No tenderness.  Abdominal:     General: Bowel sounds are normal. There is no distension.     Palpations: Abdomen is soft. There is no mass.     Tenderness: There is no abdominal tenderness. There is no guarding or  rebound.  Genitourinary:    Penis: Normal. No tenderness.      Testes: Normal.     Prostate: Normal.     Rectum: Normal. Guaiac result negative.  Musculoskeletal:        General: No tenderness. Normal range of motion.     Cervical back: Neck supple.  Lymphadenopathy:     Cervical: No cervical adenopathy.  Skin:    General: Skin is warm and dry.     Coloration: Skin is not pale.     Findings: No erythema or rash.  Neurological:     General: No focal deficit present.     Mental Status: He is alert and oriented to person, place, and time.     Cranial Nerves: No cranial nerve deficit.     Motor: No abnormal muscle tone.     Coordination: Coordination normal.     Deep Tendon Reflexes: Reflexes are normal and symmetric. Reflexes normal.  Psychiatric:        Mood and Affect: Mood normal.        Behavior: Behavior normal.        Thought Content: Thought  content normal.        Judgment: Judgment normal.           Assessment & Plan:  Well exam. We discussed diet and exercise. Get fasting labs. Gershon Crane, MD

## 2023-03-24 ENCOUNTER — Ambulatory Visit
Admission: RE | Admit: 2023-03-24 | Discharge: 2023-03-24 | Disposition: A | Discharge status: Home / Self Care | Payer: No Typology Code available for payment source | Source: Ambulatory Visit | Attending: Sports Medicine | Admitting: Sports Medicine

## 2023-03-24 DIAGNOSIS — G8929 Other chronic pain: Secondary | ICD-10-CM

## 2023-03-28 ENCOUNTER — Telehealth: Payer: Self-pay | Admitting: Sports Medicine

## 2023-03-28 NOTE — Telephone Encounter (Signed)
I spoke with Michelle Piper on the phone yesterday after reviewing MRI findings of the left knee.  He has an oblique tear of the medial meniscus with a small piece of meniscus material which is flipped.  He also has mild degenerative changes.  His knee is feeling much better after recent cortisone injection.  He states it is about 95% improved.  He denies mechanical symptoms such as locking and popping.  Since he is doing well clinically I have deferred referral to orthopedics.  Instead, he will continue to strengthen his hips and quads and I recommended that he avoid impact exercise such as running.  He may instead focus on stationary bike or maybe elliptical.  If his pain once again returns or he begins to develop mechanical symptoms then I would reconsider referral to orthopedics.  Follow-up as needed.

## 2023-04-13 ENCOUNTER — Other Ambulatory Visit: Payer: Self-pay | Admitting: Sports Medicine

## 2023-11-10 DIAGNOSIS — H0288B Meibomian gland dysfunction left eye, upper and lower eyelids: Secondary | ICD-10-CM | POA: Diagnosis not present

## 2023-11-10 DIAGNOSIS — H04123 Dry eye syndrome of bilateral lacrimal glands: Secondary | ICD-10-CM | POA: Diagnosis not present

## 2023-11-10 DIAGNOSIS — H1045 Other chronic allergic conjunctivitis: Secondary | ICD-10-CM | POA: Diagnosis not present

## 2023-11-10 DIAGNOSIS — H0288A Meibomian gland dysfunction right eye, upper and lower eyelids: Secondary | ICD-10-CM | POA: Diagnosis not present

## 2024-04-12 ENCOUNTER — Ambulatory Visit: Admitting: Sports Medicine

## 2024-04-12 ENCOUNTER — Encounter: Payer: Self-pay | Admitting: Sports Medicine

## 2024-04-12 ENCOUNTER — Ambulatory Visit (HOSPITAL_BASED_OUTPATIENT_CLINIC_OR_DEPARTMENT_OTHER)
Admission: RE | Admit: 2024-04-12 | Discharge: 2024-04-12 | Disposition: A | Discharge status: Home / Self Care | Source: Ambulatory Visit | Attending: Sports Medicine | Admitting: Sports Medicine

## 2024-04-12 VITALS — BP 102/78 | Ht 71.5 in | Wt 205.0 lb

## 2024-04-12 DIAGNOSIS — G8929 Other chronic pain: Secondary | ICD-10-CM | POA: Diagnosis not present

## 2024-04-12 DIAGNOSIS — M25511 Pain in right shoulder: Secondary | ICD-10-CM | POA: Insufficient documentation

## 2024-04-12 NOTE — Progress Notes (Signed)
   Subjective:    Patient ID: Peter Macias, male    DOB: December 08, 1970, 53 y.o.   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  He has had pain in this shoulder in the past.  Current pain started several weeks ago after doing some yard work.  Pain is in the anterior shoulder and worse with activity.  He does endorse some clicking and popping.  He also experiences some pain in the periscapular area as well as some neck pain.    Review of Systems As above    Objective:   Physical Exam  Well-developed, well-nourished.  No acute distress  Right shoulder: Good range of motion.  He is tender to palpation in the bicipital groove.  Positive empty can.  4/5 strength with resisted supraspinatus.  There is also pain and weakness with Yergason's testing.  Neurovascularly intact distally.      Assessment & Plan:   Right shoulder pain worrisome for rotator cuff tear  We will start with x-rays of the right shoulder and then proceed with an MRI specifically to rule out a rotator cuff tear.  There may be a tear of the subscapularis which is causing some biceps tendon instability.  We will delineate a more definitive treatment plan based on those results.  This note was dictated using Dragon naturally speaking software and may contain errors in syntax, spelling, or content which have not been identified prior to signing this note.

## 2024-04-25 ENCOUNTER — Other Ambulatory Visit

## 2024-05-03 ENCOUNTER — Other Ambulatory Visit

## 2024-06-03 ENCOUNTER — Telehealth: Payer: Self-pay | Admitting: Family Medicine

## 2024-06-03 NOTE — Telephone Encounter (Unsigned)
 Copied from CRM #[id number]. Topic: Clinical - Refused Triage >> Jun 03, 2024  1:11 PM Wess RAMAN wrote: Patient/caller voiced complaints of Pain in joints for 30 years. Would like to be tested for arthiritis. Declined transfer to triage.

## 2024-06-07 ENCOUNTER — Ambulatory Visit: Admitting: Family Medicine

## 2024-06-07 ENCOUNTER — Encounter: Payer: Self-pay | Admitting: Family Medicine

## 2024-06-07 VITALS — BP 94/66 | HR 80 | Temp 97.7°F | Ht 71.26 in | Wt 217.1 lb

## 2024-06-07 DIAGNOSIS — M2550 Pain in unspecified joint: Secondary | ICD-10-CM

## 2024-06-07 DIAGNOSIS — Z0000 Encounter for general adult medical examination without abnormal findings: Secondary | ICD-10-CM

## 2024-06-07 NOTE — Progress Notes (Signed)
 Subjective:    Patient ID: Peter Macias, male    DOB: 1970-10-13, 53 y.o.   MRN: [id number]  HPI Here for a well exam. He is doing well in general, but over the past year he has developed stiffness and pain in many of his joints, including hands, shoulder, neck , and low back. He does not take medication for this, but he stays active and he exercises frequently. He is doing stretches and Pilates to build core strength. He does not have swelling in his joints.    Review of Systems  Constitutional: Negative.   HENT: Negative.    Eyes: Negative.   Respiratory: Negative.    Cardiovascular: Negative.   Gastrointestinal: Negative.   Genitourinary: Negative.   Musculoskeletal:  Positive for arthralgias, back pain and neck pain.  Skin: Negative.   Neurological: Negative.   Psychiatric/Behavioral: Negative.         Objective:   Physical Exam Constitutional:      General: He is not in acute distress.    Appearance: Normal appearance. He is well-developed. He is not diaphoretic.  HENT:     Head: Normocephalic and atraumatic.     Right Ear: External ear normal.     Left Ear: External ear normal.     Nose: Nose normal.     Mouth/Throat:     Pharynx: No oropharyngeal exudate.  Eyes:     General: No scleral icterus.       Right eye: No discharge.        Left eye: No discharge.     Conjunctiva/sclera: Conjunctivae normal.     Pupils: Pupils are equal, round, and reactive to light.  Neck:     Thyroid : No thyromegaly.     Vascular: No JVD.     Trachea: No tracheal deviation.  Cardiovascular:     Rate and Rhythm: Normal rate and regular rhythm.     Pulses: Normal pulses.     Heart sounds: Normal heart sounds. No murmur heard.    No friction rub. No gallop.  Pulmonary:     Effort: Pulmonary effort is normal. No respiratory distress.     Breath sounds: Normal breath sounds. No wheezing or rales.  Chest:     Chest wall: No tenderness.  Abdominal:     General: Bowel sounds are  normal. There is no distension.     Palpations: Abdomen is soft. There is no mass.     Tenderness: There is no abdominal tenderness. There is no guarding or rebound.  Genitourinary:    Penis: Normal. No tenderness.      Prostate: Normal.     Rectum: Normal. Guaiac result negative.  Musculoskeletal:        General: No tenderness. Normal range of motion.     Cervical back: Neck supple.  Lymphadenopathy:     Cervical: No cervical adenopathy.  Skin:    General: Skin is warm and dry.     Coloration: Skin is not pale.     Findings: No erythema or rash.  Neurological:     General: No focal deficit present.     Mental Status: He is alert and oriented to person, place, and time.     Cranial Nerves: No cranial nerve deficit.     Motor: No abnormal muscle tone.     Coordination: Coordination normal.     Deep Tendon Reflexes: Reflexes are normal and symmetric. Reflexes normal.  Psychiatric:        Mood and Affect:  Mood normal.        Behavior: Behavior normal.        Thought Content: Thought content normal.        Judgment: Judgment normal.           Assessment & Plan:  Well exam. We discussed diet and exercise. Get fasting labs. To assess his joint pains, we will check ESR, CRP, RF, and Lyme titers.  Garnette Olmsted, MD

## 2024-06-10 ENCOUNTER — Ambulatory Visit: Payer: Self-pay | Admitting: Family Medicine

## 2024-06-12 LAB — HEMOGLOBIN A1C
Hgb A1c MFr Bld: 5.4 % (ref <5.7)
Mean Plasma Glucose: 108 mg/dL
eAG (mmol/L): 6 mmol/L

## 2024-06-12 LAB — CBC WITH DIFFERENTIAL/PLATELET
Absolute Lymphocytes: 1941 {cells}/uL (ref 850–3900)
Absolute Monocytes: 515 {cells}/uL (ref 200–950)
Basophils Absolute: 19 {cells}/uL (ref 0–200)
Basophils Relative: 0.3 %
Eosinophils Absolute: 50 {cells}/uL (ref 15–500)
Eosinophils Relative: 0.8 %
HCT: 44.3 % (ref 39.4–51.1)
Hemoglobin: 15.1 g/dL (ref 13.2–17.1)
MCH: 29.8 pg (ref 27.0–33.0)
MCHC: 34.1 g/dL (ref 31.6–35.4)
MCV: 87.4 fL (ref 81.4–101.7)
MPV: 11.2 fL (ref 7.5–12.5)
Monocytes Relative: 8.3 %
Neutro Abs: 3677 {cells}/uL (ref 1500–7800)
Neutrophils Relative %: 59.3 %
Platelets: 271 Thousand/uL (ref 140–400)
RBC: 5.07 Million/uL (ref 4.20–5.80)
RDW: 12.8 % (ref 11.0–15.0)
Total Lymphocyte: 31.3 %
WBC: 6.2 Thousand/uL (ref 3.8–10.8)

## 2024-06-12 LAB — LIPID PANEL
Cholesterol: 242 mg/dL — ABNORMAL HIGH (ref <200)
HDL: 60 mg/dL (ref >=40)
LDL Cholesterol (Calc): 154 mg/dL — ABNORMAL HIGH
Non-HDL Cholesterol (Calc): 182 mg/dL — ABNORMAL HIGH (ref <130)
Total CHOL/HDL Ratio: 4 (calc) (ref <5.0)
Triglycerides: 152 mg/dL — ABNORMAL HIGH (ref <150)

## 2024-06-12 LAB — HEPATIC FUNCTION PANEL
AG Ratio: 2.1 (calc) (ref 1.0–2.5)
ALT: 28 U/L (ref 9–46)
AST: 22 U/L (ref 10–35)
Albumin: 4.7 g/dL (ref 3.6–5.1)
Alkaline phosphatase (APISO): 77 U/L (ref 35–144)
Bilirubin, Direct: 0.2 mg/dL (ref 0.0–0.2)
Globulin: 2.2 g/dL (ref 1.9–3.7)
Indirect Bilirubin: 0.8 mg/dL (ref 0.2–1.2)
Total Bilirubin: 1 mg/dL (ref 0.2–1.2)
Total Protein: 6.9 g/dL (ref 6.1–8.1)

## 2024-06-12 LAB — BASIC METABOLIC PANEL WITH GFR
BUN: 18 mg/dL (ref 7–25)
CO2: 26 mmol/L (ref 20–32)
Calcium: 10 mg/dL (ref 8.6–10.3)
Chloride: 103 mmol/L (ref 98–110)
Creat: 0.93 mg/dL (ref 0.70–1.30)
Glucose, Bld: 90 mg/dL (ref 65–99)
Potassium: 4 mmol/L (ref 3.5–5.3)
Sodium: 138 mmol/L (ref 135–146)
eGFR: 99 mL/min/1.73m2 (ref >=60)

## 2024-06-12 LAB — SEDIMENTATION RATE: Sed Rate: 6 mm/h (ref 0–20)

## 2024-06-12 LAB — C-REACTIVE PROTEIN: CRP: <3 mg/L (ref <8.0)

## 2024-06-12 LAB — RHEUMATOID FACTOR: Rheumatoid fact SerPl-aCnc: <10 [IU]/mL (ref <14)

## 2024-06-12 LAB — TSH: TSH: 1.9 m[IU]/L (ref 0.40–4.50)

## 2024-06-12 LAB — B. BURGDORFI ANTIBODIES: B burgdorferi Ab IgG+IgM: <0.9 {index}

## 2024-06-12 LAB — PSA: PSA: 0.56 ng/mL (ref <=4.00)
